# Patient Record
Sex: Male | Born: 1950 | Race: White | Hispanic: No | Marital: Single | Smoking: Current every day smoker
Health system: Southern US, Community
[De-identification: ages and names within clinical notes are randomized; demographics above are authoritative.]

## PROBLEM LIST (undated history)

## (undated) DIAGNOSIS — I10 Essential (primary) hypertension: Secondary | ICD-10-CM

## (undated) DIAGNOSIS — J449 Chronic obstructive pulmonary disease, unspecified: Secondary | ICD-10-CM

## (undated) DIAGNOSIS — E785 Hyperlipidemia, unspecified: Secondary | ICD-10-CM

## (undated) DIAGNOSIS — E119 Type 2 diabetes mellitus without complications: Secondary | ICD-10-CM

## (undated) DIAGNOSIS — S069XAA Unspecified intracranial injury with loss of consciousness status unknown, initial encounter: Secondary | ICD-10-CM

## (undated) DIAGNOSIS — F319 Bipolar disorder, unspecified: Secondary | ICD-10-CM

## (undated) DIAGNOSIS — S069X9A Unspecified intracranial injury with loss of consciousness of unspecified duration, initial encounter: Secondary | ICD-10-CM

## (undated) DIAGNOSIS — R7401 Elevation of levels of liver transaminase levels: Secondary | ICD-10-CM

## (undated) HISTORY — PX: HERNIA REPAIR: SHX51

## (undated) MED ORDER — TRAMADOL 50 MG TAB
50 mg | ORAL_TABLET | Freq: Four times a day (QID) | ORAL | Status: AC | PRN
Start: ? — End: 2007-12-21

---

## 2007-12-10 NOTE — ED Notes (Signed)
Pt states he wants off the board, out of the collar and "going home". States "been laying here too long". Offerred comfort measure, pt states "going home". We talked about the intent of the immobilization and the risk of injury to his spinal cord with "permanent paralysis and maybe you wouldn't be able to walk, or move at all". A/O x4. Speech clear. Uanccompanied. Pt states "don't care, I'm getting out of here".  Board/collard DC'd with patient assistance, actively. Up at bedside. AMA signed. States "going outside to smoke and think about this:". We discuss that if he leaves the building, he is signed out, and would have to be re-registered if he comes back. Pt leaves the building with his cane. NAD.

## 2007-12-11 NOTE — ED Provider Notes (Signed)
Fall  The history is provided by the patient. The accident occurred yesterday. The fall occurred while walking. He landed on hard floor. There was no blood loss. The point of impact was the head and neck (Lower back). The pain is present in the neck (lower back). The pain is at a severity of 5/10. The pain is moderate. He was ambulatory at the scene. There was no entrapment after the fall. There was no drug use involved in the accident. There was no alcohol use involved in the accident. Pertinent negatives include no visual change, no numbness, no abdominal pain, no headaches, no loss of consciousness and no tingling. The risk factors include being elderly.  The symptoms are worsened by activity. He has tried nothing for the symptoms.        Past Medical History   Diagnosis Date   ??? Hypertension    ??? COPD      emphysema   ??? Asthma    ??? Cancer      skin   ??? Psychiatric Disorder      bipolar          No past surgical history on file.      No family history on file.     History   Social History   ??? Marital Status: Divorced     Spouse Name: N/A     Number of Children: N/A   ??? Years of Education: N/A   Occupational History   ??? Not on file.   Social History Main Topics   ??? Tobacco Use: Never   ??? Alcohol Use: Yes   ??? Drug Use: Yes     Special: Marijuana   ??? Sexually Active:    Other Topics Concern   ??? Not on file   Social History Narrative   ??? No narrative on file           ALLERGIES: Review of patient's allergies indicates no known allergies.      Review of Systems   Constitutional: Negative.  Negative for weight loss, malaise/fatigue and diaphoresis.   HENT: Positive for neck pain.    Eyes: Negative.    Respiratory: Negative for hemoptysis, shortness of breath and wheezing.    Cardiovascular: Negative.  Negative for chest pain, palpitations and leg swelling.   Gastrointestinal: Negative.  Negative for abdominal pain.   Genitourinary: Negative.    Musculoskeletal: Positive for back pain. Negative for extremity weakness.    Neurological: Negative for tingling, loss of consciousness, numbness and headaches.   All other systems reviewed and are negative.      Filed Vitals:    12/11/2007 10:17 AM   BP: 123/82   Pulse: 88   Temp: 97.8 ??F (36.6 ??C)   Resp: 16   Height: 5\' 11"  (1.803 m)   Weight: 225 lb (102.059 kg)   SpO2: 97%              Physical Exam   Nursing note and vitals reviewed.  Constitutional: He is oriented. He appears well-developed and well-nourished.   HENT:   Head: Normocephalic and atraumatic.   Right Ear: External ear normal.   Left Ear: External ear normal.   Nose: Nose normal.   Mouth/Throat: Oropharynx is clear and moist.   Eyes: Conjunctivae are normal. Pupils are equal, round, and reactive to light.   Neck: Normal range of motion. Neck supple.   Cardiovascular: Normal rate, regular rhythm, normal heart sounds and intact distal pulses.    Pulmonary/Chest:  Effort normal and breath sounds normal.   Abdominal: Soft. Bowel sounds are normal.   Musculoskeletal: Normal range of motion.        Cervical back: He exhibits tenderness.        Lumbar back: He exhibits tenderness and pain.        Back:    Neurological: He is alert and oriented.   Skin: Skin is warm, dry and intact.   Psychiatric: He has a normal mood and affect. His behavior is normal. Judgment and thought content normal.        MDM Coding   Reviewed: nursing note, vitals and previous chart  Interpretation: x-ray

## 2007-12-11 NOTE — ED Notes (Signed)
Pt d/c instructions complete. Pt verbalized understanding. Script received

## 2007-12-11 NOTE — ED Notes (Signed)
Pt to xray via stretcher, NAD

## 2007-12-11 NOTE — ED Notes (Signed)
States he was typed in here yesterday. Couldn't wait to be seen and left AMA.

## 2007-12-11 NOTE — ED Notes (Signed)
sts was going to sit on sidewalk and fell as sitting, c/o pain in neck 8/10, pain in low back 9/10, sts arms "look like they startin' to swell up again", sts bright lights "like sun shinin' nd high stainless steel, it blinds me"

## 2007-12-11 NOTE — ED Notes (Signed)
X-rays reviewed - degenerative changes noted. No acute findings. The patient was observed in the ED.    Results Reviewed:      No results found for this or any previous visit (from the past 24 hour(s)).    I discussed the results of all labs, procedures, radiographs, and treatments with the patient and available family.  Treatment plan is agreed upon and the patient is ready for discharge.  All voiced understanding of the discharge plan and medication instructions or changes as appropriate.  Questions about treatment in the ED were answered.  All were encouraged to return should symptoms worsen or new problems develop.

## 2008-02-16 LAB — DRUG SCREEN, URINE
ACETAMINOPHEN: NEGATIVE
AMPHETAMINES: NEGATIVE
BARBITURATES: NEGATIVE
BENZODIAZEPINES: NEGATIVE
COCAINE: NEGATIVE
METHADONE: NEGATIVE
Methamphetamines: NEGATIVE
OPIATES: NEGATIVE
PCP(PHENCYCLIDINE): NEGATIVE
THC (TH-CANNABINOL): NEGATIVE
TRICYCLICS: NEGATIVE

## 2008-02-16 LAB — METABOLIC PANEL, COMPREHENSIVE
A-G Ratio: 1.1 — ABNORMAL LOW (ref 1.2–3.5)
ALT (SGPT): 28 U/L — ABNORMAL LOW (ref 30–65)
AST (SGOT): 12 U/L — ABNORMAL LOW (ref 15–37)
Albumin: 3.7 g/dL (ref 3.5–5.0)
Alk. phosphatase: 100 U/L (ref 50–136)
Anion gap: 6 mmol/L — ABNORMAL LOW (ref 7–16)
BUN: 3 mg/dL — ABNORMAL LOW (ref 7–18)
Bilirubin, total: 0.3 MG/DL (ref 0.2–1.1)
CO2: 29 MMOL/L (ref 21–32)
Calcium: 9.3 MG/DL (ref 8.4–10.4)
Chloride: 105 MMOL/L (ref 98–107)
Creatinine: 1.1 mg/dL (ref 0.8–1.3)
GFR est AA: >60 mL/min/{1.73_m2} (ref >60)
GFR est non-AA: >60 mL/min/{1.73_m2} (ref >60)
Globulin: 3.4 g/dL (ref 2.3–3.5)
Glucose: 110 MG/DL — ABNORMAL HIGH (ref 74–106)
Potassium: 3.8 MMOL/L (ref 3.5–5.1)
Protein, total: 7.1 g/dL (ref 6.3–8.2)
Sodium: 140 MMOL/L (ref 136–145)

## 2008-02-16 LAB — CBC WITH AUTOMATED DIFF
ABS. BASOPHILS: 0 10*3/uL (ref 0.0–0.2)
ABS. EOSINOPHILS: 0.1 10*3/uL (ref 0.00–0.80)
ABS. IMM. GRANS.: 0 10*3/uL (ref 0.0–2.0)
ABS. LYMPHOCYTES: 2.9 10*3/uL (ref 0.6–4.3)
ABS. MONOCYTES: 1 10*3/uL — ABNORMAL HIGH (ref 0.1–0.9)
ABS. NEUTROPHILS: 3.1 10*3/uL (ref 1.9–7.8)
BASOPHILS: 0 % — ABNORMAL LOW (ref 0.1–1.6)
EOSINOPHILS: 2 % (ref 0.5–7.8)
HCT: 37.3 % — ABNORMAL LOW (ref 38.1–48.4)
HGB: 13.6 g/dL (ref 12.8–16.5)
IMMATURE GRANULOCYTES: 0.3 % (ref 0.0–2.0)
LYMPHOCYTES: 40 % (ref 14.7–41.3)
MCH: 33.3 PG — ABNORMAL HIGH (ref 26.1–32.9)
MCHC: 36.5 g/dL — ABNORMAL HIGH (ref 31.4–35.0)
MCV: 91.4 FL (ref 79.6–97.8)
MONOCYTES: 14 % — ABNORMAL HIGH (ref 3.2–9.0)
MPV: 9.1 FL — ABNORMAL LOW (ref 9.3–12.9)
NEUTROPHILS: 44 % — ABNORMAL LOW (ref 47.0–74.6)
PLATELET: 187 10*3/uL (ref 140–440)
RBC: 4.08 M/uL — ABNORMAL LOW (ref 4.27–5.68)
RDW: 13.3 % (ref 11.9–14.6)
WBC: 7.1 10*3/uL (ref 4.5–10.5)

## 2008-02-16 NOTE — ED Notes (Signed)
Pt sent from MD office. They state "he kept falling asleep". Pt states "is very tired, was awake all last night clean house, has no other c/o at this time". Pt nad

## 2008-02-16 NOTE — ED Provider Notes (Signed)
HPI Comments: 31 wm sent from the PT clinic because he kept falling asleep. He states he lives in a government supsidized apartment and he had an inspection scheduled today so he spent the entire night cleaning his apartment. He did not want to loose his apartment.     Lethargy  The history is provided by the patient. The current episode started 1 to 2 hours ago. The problem occurs constantly. The problem has not changed since onset. hypersomniaNothing worsens the symptoms. Nothing relieves the symptoms. He has tried nothing for the symptoms.        Past Medical History   Diagnosis Date   ??? Hypertension    ??? COPD      emphysema   ??? Asthma    ??? Cancer      skin   ??? Sleep Disorder    ??? Psychiatric Disorder      bipolar          No past surgical history on file.      No family history on file.     History   Social History   ??? Marital Status: Divorced     Spouse Name: N/A     Number of Children: N/A   ??? Years of Education: N/A   Occupational History   ??? Not on file.   Social History Main Topics   ??? Tobacco Use: Never   ??? Alcohol Use: Yes   ??? Drug Use: Yes     Special: Marijuana   ??? Sexually Active:    Other Topics Concern   ??? Not on file   Social History Narrative   ??? No narrative on file           ALLERGIES: Review of patient's allergies indicates no known allergies.      Review of Systems   All other systems reviewed and are negative.      Filed Vitals:    02/16/2008  3:52 PM 02/16/2008  3:59 PM   BP:  103/65   Pulse:  76   Temp: 97.5 ??F (36.4 ??C)    Resp:  18   Height:  6' (1.829 m)   Weight:  217 lb (98.431 kg)   SpO2:  95%              Physical Exam   Nursing note and vitals reviewed.  Constitutional: He is oriented. He appears well-developed and well-nourished. No distress.   HENT:   Head: Normocephalic and atraumatic.   Right Ear: External ear normal.   Left Ear: External ear normal.   Nose: Nose normal.   Mouth/Throat: Oropharynx is clear and moist. No oropharyngeal exudate.    Eyes: Conjunctivae and extraocular motions are normal. Pupils are equal, round, and reactive to light.   Neck: Normal range of motion. Neck supple.   Cardiovascular: Normal rate, regular rhythm, normal heart sounds and intact distal pulses.    Pulmonary/Chest: Effort normal and breath sounds normal. No respiratory distress.   Abdominal: Soft. Bowel sounds are normal. No tenderness.   Musculoskeletal: Normal range of motion. He exhibits no tenderness.   Neurological: He is alert and oriented. He displays normal reflexes. No cranial nerve deficit. He exhibits normal muscle tone. Coordination normal.        Grip 5/5 and L= R   Skin: Skin is warm. No rash noted.            Coding

## 2008-02-16 NOTE — ED Notes (Signed)
Pt relations called to get pt ride home.

## 2008-02-28 MED ORDER — DICLOFENAC 75 MG TAB, DELAYED RELEASE
75 mg | ORAL_TABLET | Freq: Two times a day (BID) | ORAL | Status: AC
Start: 2008-02-28 — End: 2008-03-09

## 2008-02-28 MED ORDER — CYCLOBENZAPRINE 5 MG TAB
5 mg | ORAL_TABLET | Freq: Three times a day (TID) | ORAL | Status: AC
Start: 2008-02-28 — End: 2008-03-06

## 2008-02-28 NOTE — ED Provider Notes (Signed)
HPI Comments: Pt c/o tripping over a water meter and falling down a hill.  States he hurt his neck, low back, right shoulder, and right forearm during the fall.  He denies LOC, nausea, or any other complaints.    Back Pain   The history is provided by the patient. This is a new problem. The current episode started 2 days ago. The problem has been gradually worsening. The problem occurs constantly. Patient reports no work related injury.The pain is associated with a fall. The pain is present in the lumbar spine, upper back and right side. The pain quality is described as sharp. The pain does not radiate. The pain is moderate. The pain is the same all the time. Stiffness is present all day. Pertinent negatives include no numbness, no headaches, no abdominal pain, no bowel incontinence, no perianal numbness, no paresthesias, no paresis, no tingling and no weakness. He has tried nothing for the symptoms. The patient's surgical history non-contributory        Past Medical History   Diagnosis Date   ??? Hypertension    ??? COPD      emphysema   ??? Asthma    ??? Cancer      skin   ??? Sleep Disorder    ??? Psychiatric Disorder      bipolar          No past surgical history on file.      No family history on file.     History   Social History   ??? Marital Status: Divorced     Spouse Name: N/A     Number of Children: N/A   ??? Years of Education: N/A   Occupational History   ??? Not on file.   Social History Main Topics   ??? Tobacco Use: Yes -- 2.0 packs/day   ??? Alcohol Use: Yes   ??? Drug Use: Yes     Special: Marijuana   ??? Sexually Active:    Other Topics Concern   ??? Not on file   Social History Narrative   ??? No narrative on file           ALLERGIES: Review of patient's allergies indicates no known allergies.      Review of Systems   Constitutional: Negative.    HENT: Negative.    Eyes: Negative.    Respiratory: Negative.    Cardiovascular: Negative.    Gastrointestinal: Negative.  Negative for abdominal pain.    Musculoskeletal: Positive for myalgias, back pain and arthralgias. Negative for joint swelling and gait problem.   Neurological: Negative.  Negative for tingling, weakness, numbness and headaches.   All other systems reviewed and are negative.      Filed Vitals:              Physical Exam   Nursing note and vitals reviewed.  Constitutional: He is oriented. He appears well-developed and well-nourished. He appears not diaphoretic. No distress.   HENT:   Head: Normocephalic and atraumatic.   Mouth/Throat: Oropharynx is clear and moist.   Eyes: Conjunctivae and extraocular motions are normal. Pupils are equal, round, and reactive to light.   Neck: Normal range of motion. Neck supple. No tracheal deviation present.   Musculoskeletal: Normal range of motion. He exhibits tenderness.        Cervical back: He exhibits tenderness and pain. He exhibits normal range of motion, no bony tenderness, no laceration and no spasm.        Lumbar back:  He exhibits tenderness, pain and spasm. He exhibits normal range of motion, no bony tenderness, no swelling, no deformity and no laceration.   Neurological: He is alert and oriented. No cranial nerve deficit.   Skin: Skin is warm and dry. No rash noted. He is not diaphoretic. No erythema.            Coding

## 2008-05-21 LAB — PROTHROMBIN TIME + INR
INR: 1.7 — ABNORMAL HIGH (ref 0.9–1.2)
Prothrombin time: 16.2 SECS — ABNORMAL HIGH (ref 9.5–12.0)

## 2008-05-21 MED ORDER — TRAMADOL-ACETAMINOPHEN 37.5 MG-325 MG TAB
ORAL_TABLET | Freq: Four times a day (QID) | ORAL | Status: AC | PRN
Start: 2008-05-21 — End: 2008-05-31

## 2008-05-21 MED ORDER — CEPHALEXIN 500 MG CAP
500 mg | ORAL_CAPSULE | Freq: Three times a day (TID) | ORAL | Status: AC
Start: 2008-05-21 — End: 2008-05-28

## 2008-05-21 MED ADMIN — cefTRIAXone (ROCEPHIN) 0.5 g in lidocaine (XYLOCAINE) IM syringe: INTRAMUSCULAR | @ 15:00:00 | NDC 54569441500

## 2008-05-21 MED ADMIN — tramadol (ULTRAM) tablet 100 mg: ORAL | @ 12:00:00 | NDC 76439013650

## 2008-05-21 NOTE — ED Notes (Signed)
Dr. Olson at bedside for eval

## 2008-05-21 NOTE — ED Provider Notes (Signed)
HPI Comments: 57 y.o. 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 He remains on Coumadin. Pt has chr lower leg swelling and uses compression stockings. He c/o sev days incr swelling and pain both lower legs. PE There is chr stasis dermatitis with erythema, 3+ pitting edema. Not particularly tender.    Patient is a 57 y.o. male presenting with leg pain. The history is provided by the patient.   Leg Pain   This is a recurrent problem. The current episode started more than 2 days ago. The problem occurs constantly. The problem has been gradually worsening. The pain is present in the left lower leg and right lower leg. The pain quality is described as aching. The pain is at a severity of 5/10. The pain is moderate. Pertinent negatives include no numbness, no limited range of motion and no neck pain. The symptoms are worsened by standing.        Past Medical History   Diagnosis Date   ??? Hypertension    ??? Thromboembolus    ??? COPD      emphysema   ??? Asthma    ??? Cancer      skin   ??? Sleep Disorder    ??? Psychiatric Disorder      bipolar          No past surgical history on file.      No family history on file.     History   Social History   ??? Marital Status: Divorced     Spouse Name: N/A     Number of Children: N/A   ??? Years of Education: N/A   Occupational History   ??? Not on file.   Social History Main Topics   ??? Tobacco Use: Yes -- 2.0 packs/day   ??? Alcohol Use: Yes   ??? Drug Use: Yes     Special: Marijuana   ??? Sexually Active:    Other Topics Concern   ??? Not on file   Social History Narrative   ??? No narrative on file           ALLERGIES: Haldol      Review of Systems   Constitutional: Positive for activity change. Negative for fever.   HENT: Negative for congestion and neck pain.    Respiratory: Negative for shortness of breath.    Cardiovascular: Negative for chest pain.   Gastrointestinal: Negative for vomiting and abdominal pain.    Musculoskeletal: Positive for myalgias. Negative for joint swelling.   Skin: Positive for rash and color change.   Neurological: Negative for numbness and headaches.   Psychiatric/Behavioral: Negative for behavioral problem.    All other systems reviewed and are negative.      Filed Vitals:    05/21/2008  7:46 AM   BP: 113/64   Pulse: 89   Temp: 97.2 ??F (36.2 ??C)   Resp: 20   Height: 5' 11.75" (1.822 m)   Weight: 214 lb (97.07 kg)   SpO2: 94%              Physical Exam   Nursing note and vitals reviewed.  Constitutional: He is oriented. He appears well-developed and well-nourished.   HENT:   Head: Normocephalic.   Eyes: Conjunctivae and extraocular motions are normal. Pupils are equal, round, and reactive to light.   Neck: Normal range of motion.   Cardiovascular: Normal rate.    Pulmonary/Chest: Effort normal and breath sounds normal. No respiratory distress.  Abdominal: Soft. No tenderness.   Musculoskeletal: Normal range of motion. He exhibits edema.   Neurological: He is alert and oriented.   Skin: Skin is warm and dry. Rash noted.   Psychiatric: His behavior is normal.            MDM Coding   Reviewed: previous chart, nursing note and vitals  Interpretation: ultrasound and labs          Procedures

## 2008-05-21 NOTE — ED Notes (Signed)
Pt returned and placed on cycled bp's with sat probe in place.

## 2008-05-21 NOTE — ED Notes (Signed)
Pt sts legs are burning, itching, feet feel numb, sts dx with dvt's

## 2008-05-21 NOTE — ED Notes (Signed)
Pt to U/S via stretcher,NAD

## 2008-05-21 NOTE — ED Notes (Addendum)
Duplex lower extrems - no DVT

## 2008-08-13 NOTE — ED Notes (Signed)
I have reviewed discharge instructions with the patient.  The patient verbalized understanding.

## 2008-08-13 NOTE — ED Provider Notes (Addendum)
Patient is a 57 y.o. male presenting with chest pain. The history is provided by the patient. No language interpreter was used.   Chest Pain (Angina)   This is a new problem. The current episode started 1 to 2 hours ago. The problem has been resolved. Duration of episode(s) is 1 hour. The pain is associated with normal activity. The pain is present in the left side. The pain is at a severity of 0/10 (Was 6/10). The patient is experiencing no pain. The quality of the pain is described as dull. The pain does not radiate. Exacerbated by: nothing. Associated symptoms include cough. Pertinent negatives include no diaphoresis, no fever, no malaise/fatigue, no numbness, no claudication, no exertional chest pressure, no irregular heartbeat, no near-syncope, no orthopnea, no palpitations, no PND, no abdominal pain, no nausea, no vomiting, no headaches, no back pain, no leg pain, no lower extremity edema, no dizziness, no weakness, no hemoptysis, no shortness of breath and no sputum production. He has tried nothing for the symptoms. Risk factors include smoking/tobacco exposure, cardiac disease, diabetes mellitus, obesity, hypertension and male gender. His past medical history is significant for DM, DVT and HTN.Procedural history includes cardiac catheterization.       Past Medical History   Diagnosis Date   ??? Hypertension    ??? Thromboembolus    ??? COPD      emphysema   ??? Asthma    ??? Cancer      skin   ??? Sleep Disorder    ??? Psychiatric Disorder      bipolar   ??? Diabetes      NIDDM   ??? Other Ill-Defined Conditions      DVT ble/ lue          Past Surgical History   Procedure Date   ??? Cardiac surg procedure unlist      heart cath 2007           No family history on file.     History   Social History   ??? Marital Status: Divorced     Spouse Name: N/A     Number of Children: N/A   ??? Years of Education: N/A   Occupational History   ??? Not on file.   Social History Main Topics   ??? Tobacco Use: Yes -- 2.0 packs/day    ??? Alcohol Use: Yes   ??? Drug Use: Yes     Special: Marijuana   ??? Sexually Active:    Other Topics Concern   ??? Not on file   Social History Narrative   ??? No narrative on file           ALLERGIES: Latex, natural rubber and Haldol      Review of Systems   Constitutional: Negative for fever, malaise/fatigue and diaphoresis.   Respiratory: Positive for cough. Negative for hemoptysis, sputum production and shortness of breath.    Cardiovascular: Positive for chest pain. Negative for palpitations, orthopnea, claudication and PND.   Gastrointestinal: Negative for nausea, vomiting and abdominal pain.   Musculoskeletal: Negative for back pain.   Neurological: Negative for dizziness, weakness, numbness and headaches.   All other systems reviewed and are negative.        Filed Vitals:    08/13/2008  7:52 PM 08/13/2008  7:54 PM   BP:  144/81   Pulse: 77    Temp: 97.6 ??F (36.4 ??C)    Resp: 18    Height:  5\' 11"  (1.803 m)  Weight:  214 lb (97.07 kg)   SpO2:  100%              Physical Exam   Nursing note and vitals reviewed.  Constitutional: He is oriented. He appears well-developed and well-nourished. No distress.   HENT:   Head: Normocephalic and atraumatic.   Right Ear: External ear normal.   Left Ear: External ear normal.   Nose: Nose normal.   Mouth/Throat: Oropharynx is clear and moist. No oropharyngeal exudate.   Eyes: Conjunctivae and extraocular motions are normal. Pupils are equal, round, and reactive to light. Right eye exhibits no discharge. Left eye exhibits no discharge. No scleral icterus.   Neck: Normal range of motion. Neck supple. No JVD present. No tracheal deviation present. No thyromegaly present.   Cardiovascular: Normal rate, regular rhythm, normal heart sounds and intact distal pulses.  Exam reveals no gallop and no friction rub.    No murmur heard.  Pulmonary/Chest: Effort normal and breath sounds normal. No stridor. No respiratory distress. He has no wheezes. He has no rales. He exhibits no tenderness.    Abdominal: Soft. Bowel sounds are normal. He exhibits no distension and no mass. No tenderness. He has no rebound and no guarding.   Musculoskeletal: Normal range of motion. He exhibits no edema and no tenderness.   Lymphadenopathy:     He has no cervical adenopathy.   Neurological: He is alert and oriented. No cranial nerve deficit. He exhibits normal muscle tone. Coordination normal.   Skin: Skin is warm and dry. No rash noted. He is not diaphoretic. No erythema. No pallor.   Psychiatric: He has a normal mood and affect. His behavior is normal.            Coding      Procedures    The patient was observed in the ED.  No chest pain during his ED visit.    Results Reviewed:  CARDIAC ENZYMES - NEGATIVE; EKG - NSR, RATE 77; CXR - NEGATIVE       Recent Results (from the past 24 hour(s))   CBC W/ AUTOMATED DIFF    Collection Time 08/13/08  8:00 PM   Component Value Range   ??? WBC 7.3  4.0 - 10.5 (K/uL)   ??? RBC 4.19 (*) 4.27 - 5.68 (M/uL)   ??? HGB 13.9  12.8 - 16.5 (g/dL)   ??? HCT 38.8 (*) 40.4 - 52.1 (%)   ??? MCV 92.6  79.6 - 97.8 (FL)   ??? MCH 33.2 (*) 26.1 - 32.9 (PG)   ??? MCHC 35.8 (*) 31.4 - 35.0 (g/dL)   ??? RDW 13.3  11.9 - 14.6 (%)   ??? PLATELET 162  140 - 440 (K/uL)   ??? MPV 9.7 (*) 10.8 - 14.1 (FL)   ??? DF AUTOMATED     ??? NEUTROPHILS 38 (*) 43 - 78 (%)   ??? LYMPHOCYTES 47 (*) 13 - 44 (%)   ??? MONOCYTES 11  4.0 - 12.0 (%)   ??? EOSINOPHILS 3 (*) 0.0 - 0.7 (%)   ??? BASOPHILS 1  0.0 - 2.0 (%)   ??? ABSOLUTE NEUTS 2.8  1.7 - 8.2 (K/UL)   ??? ABSOLUTE LYMPHS 3.4  0.5 - 4.6 (K/UL)   ??? ABSOLUTE MONOS 0.8  0.1 - 1.3 (K/UL)   ??? ABSOLUTE EOSINS 0.2  0.0 - 0.2 (K/UL)   ??? ABSOLUTE BASOS 0.0  0.0 - 0.2 (K/UL)   ??? ABS. IMM. GRANS. 0.1  0.0 - 2.0 (  K/UL)   METABOLIC PANEL, COMPREHENSIVE    Collection Time 08/13/08  8:00 PM   Component Value Range   ??? Sodium 135 (*) 136 - 145 (MMOL/L)   ??? Potassium 3.9  3.5 - 5.1 (MMOL/L)   ??? Chloride 99  98 - 107 (MMOL/L)   ??? CO2 26  21 - 32 (MMOL/L)   ??? Anion gap 10  7 - 16 (mmol/L)    ??? Glucose 80  74 - 106 (MG/DL)   ??? BUN 8  7 - 18 (MG/DL)   ??? Creatinine 1.1 (*) 0.6 - 1.0 (MG/DL)   ??? GFR est AA >60  >60 (ml/min/1.48m2)   ??? GFR est non-AA >60  >60 (ml/min/1.17m2)   ??? Calcium 9.0  8.4 - 10.4 (MG/DL)   ??? Bilirubin, total 0.4  0.2 - 1.1 (MG/DL)   ??? ALT 25 (*) 39 - 65 (U/L)   ??? AST 14 (*) 15 - 37 (U/L)   ??? Alk. phosphatase 102  50 - 136 (U/L)   ??? Protein, total 7.3  6.3 - 8.2 (g/dL)   ??? Albumin 3.7  3.5 - 5.0 (g/dL)   ??? Globulin 3.6 (*) 2.3 - 3.5 (g/dL)   ??? A-G Ratio 1.0 (*) 1.2 - 3.5 ( )         I discussed the results of all labs, procedures, radiographs, and treatments with the patient and available family.  Treatment plan is agreed upon and the patient is ready for discharge.  All voiced understanding of the discharge plan and medication instructions or changes as appropriate.  Questions about treatment in the ED were answered.  All were encouraged to return should symptoms worsen or new problems develop.

## 2008-08-13 NOTE — ED Notes (Addendum)
Pt c/o L sided chest pain described as "a rubber hammer hitting a piece of wood; like a thumping feeling." c/o sob, dizziness and n/v yesterday;. Dr Clovis Riley has evaluated pt and is putting in orders; pt has had normal heart cath in past per pt.

## 2008-08-13 NOTE — ED Notes (Signed)
Contacted yellow cab for transportation home.

## 2008-08-13 NOTE — ED Notes (Signed)
Pt requesting taxi home; will call house supervisor.

## 2008-08-14 LAB — METABOLIC PANEL, COMPREHENSIVE
A-G Ratio: 1 — ABNORMAL LOW (ref 1.2–3.5)
ALT (SGPT): 25 U/L — ABNORMAL LOW (ref 39–65)
AST (SGOT): 14 U/L — ABNORMAL LOW (ref 15–37)
Albumin: 3.7 g/dL (ref 3.5–5.0)
Alk. phosphatase: 102 U/L (ref 50–136)
Anion gap: 10 mmol/L (ref 7–16)
BUN: 8 MG/DL (ref 7–18)
Bilirubin, total: 0.4 MG/DL (ref 0.2–1.1)
CO2: 26 MMOL/L (ref 21–32)
Calcium: 9 MG/DL (ref 8.4–10.4)
Chloride: 99 MMOL/L (ref 98–107)
Creatinine: 1.1 MG/DL — ABNORMAL HIGH (ref 0.6–1.0)
GFR est AA: >60 mL/min/{1.73_m2} (ref >60)
GFR est non-AA: >60 mL/min/{1.73_m2} (ref >60)
Globulin: 3.6 g/dL — ABNORMAL HIGH (ref 2.3–3.5)
Glucose: 80 MG/DL (ref 74–106)
Potassium: 3.9 MMOL/L (ref 3.5–5.1)
Protein, total: 7.3 g/dL (ref 6.3–8.2)
Sodium: 135 MMOL/L — ABNORMAL LOW (ref 136–145)

## 2008-08-14 LAB — CBC WITH AUTOMATED DIFF
ABS. BASOPHILS: 0 10*3/uL (ref 0.0–0.2)
ABS. EOSINOPHILS: 0.2 10*3/uL (ref 0.0–0.2)
ABS. IMM. GRANS.: 0.1 10*3/uL (ref 0.0–2.0)
ABS. LYMPHOCYTES: 3.4 10*3/uL (ref 0.5–4.6)
ABS. MONOCYTES: 0.8 10*3/uL (ref 0.1–1.3)
ABS. NEUTROPHILS: 2.8 10*3/uL (ref 1.7–8.2)
BASOPHILS: 1 % (ref 0.0–2.0)
EOSINOPHILS: 3 % — ABNORMAL HIGH (ref 0.0–0.7)
HCT: 38.8 % — ABNORMAL LOW (ref 40.4–52.1)
HGB: 13.9 g/dL (ref 12.8–16.5)
LYMPHOCYTES: 47 % — ABNORMAL HIGH (ref 13–44)
MCH: 33.2 PG — ABNORMAL HIGH (ref 26.1–32.9)
MCHC: 35.8 g/dL — ABNORMAL HIGH (ref 31.4–35.0)
MCV: 92.6 FL (ref 79.6–97.8)
MONOCYTES: 11 % (ref 4.0–12.0)
MPV: 9.7 FL — ABNORMAL LOW (ref 10.8–14.1)
NEUTROPHILS: 38 % — ABNORMAL LOW (ref 43–78)
PLATELET: 162 10*3/uL (ref 140–440)
RBC: 4.19 M/uL — ABNORMAL LOW (ref 4.27–5.68)
RDW: 13.3 % (ref 11.9–14.6)
WBC: 7.3 10*3/uL (ref 4.0–10.5)

## 2008-10-02 LAB — CBC WITH AUTOMATED DIFF
ABS. BASOPHILS: 0 10*3/uL (ref 0.0–0.2)
ABS. EOSINOPHILS: 0.2 10*3/uL (ref 0.0–0.8)
ABS. IMM. GRANS.: 0 10*3/uL (ref 0.0–2.0)
ABS. LYMPHOCYTES: 3.4 10*3/uL (ref 0.5–4.6)
ABS. MONOCYTES: 0.7 10*3/uL (ref 0.1–1.3)
ABS. NEUTROPHILS: 2.9 10*3/uL (ref 1.7–8.2)
BASOPHILS: 1 % (ref 0.0–2.0)
EOSINOPHILS: 3 % (ref 0.5–7.8)
HCT: 39.7 % — ABNORMAL LOW (ref 40.4–52.1)
HGB: 14.1 g/dL (ref 12.8–16.5)
LYMPHOCYTES: 46 % — ABNORMAL HIGH (ref 13–44)
MCH: 32.9 PG (ref 26.1–32.9)
MCHC: 35.5 g/dL — ABNORMAL HIGH (ref 31.4–35.0)
MCV: 92.8 FL (ref 79.6–97.8)
MONOCYTES: 10 % (ref 4.0–12.0)
MPV: 9.6 FL — ABNORMAL LOW (ref 10.8–14.1)
NEUTROPHILS: 40 % — ABNORMAL LOW (ref 43–78)
PLATELET: 199 10*3/uL (ref 140–440)
RBC: 4.28 M/uL (ref 4.27–5.68)
RDW: 13.4 % (ref 11.9–14.6)
WBC: 7.3 10*3/uL (ref 4.0–10.5)

## 2008-10-02 LAB — PROTHROMBIN TIME + INR
INR: 2 — ABNORMAL HIGH (ref 0.9–1.2)
Prothrombin time: 19.4 SECS — ABNORMAL HIGH (ref 9.4–10.8)

## 2008-10-02 MED ORDER — CEPHALEXIN 500 MG CAP
500 mg | ORAL_CAPSULE | Freq: Two times a day (BID) | ORAL | Status: AC
Start: 2008-10-02 — End: 2008-10-12

## 2008-10-02 NOTE — ED Notes (Signed)
Offered breakfast tray, PT interested, tray ordered

## 2008-10-02 NOTE — ED Notes (Signed)
Patient's Medications   Start Taking    CEPHALEXIN (KEFLEX) 500 MG CAPSULE    Take 2 Caps by mouth two (2) times a day for 10 days.   Continue Taking    BENZTROPINE (COGENTIN) 2 MG TABLET    Take 2 mg by mouth two (2) times a day.    DIVALPROEX EC (DEPAKOTE) 500 MG EC TABLET    Take 500 mg by mouth daily. 5 tablets at night     DOCUSATE SODIUM (COLACE PO)    take  by mouth two (2) times a day.    HYDROCODONE-ACETAMINOPHEN (LORTAB 7.5) 7.5-500 MG PER TABLET    take 1 Tab by mouth two (2) times a day.    LISINOPRIL (PRINIVIL, ZESTRIL) 10 MG TABLET    Take 10 mg by mouth daily.    LORATADINE (CLARITIN) 10 MG TABLET    take 10 mg by mouth daily.    METFORMIN (GLUCOPHAGE) 500 MG TABLET    Take 500 mg by mouth two (2) times daily (with meals).    RISPERIDONE (RISPERDAL) 3 MG TABLET    Take 4 mg by mouth nightly. 2 tabs in am    TRAZODONE HCL (TRAZODONE PO)    Take 50 mg by mouth nightly.    WARFARIN (COUMADIN) 3 MG TABLET    Take 3 mg by mouth daily.   These Medications have changed    No medications on file   Stop Taking    OTHER,NON-FORMULARY,    Take 250 mg by mouth. Pt does not remember the name of BP med

## 2008-10-02 NOTE — ED Provider Notes (Signed)
HPI Comments: Pt asleep x 2 on arrival to room    Patient is a 58 y.o. male presenting with leg pain and sinusitis. The history is provided by the patient. No language interpreter was used.   Leg Pain   This is a recurrent problem. The current episode started more than 1 week ago. The problem occurs constantly. The problem has not changed since onset. The pain is present in the left lower leg and right lower leg. The quality of the pain is described as aching. The pain is at a severity of 3/10. Associated symptoms include limited range of motion and stiffness. Pertinent negatives include no numbness. The symptoms are aggravated by contact and palpation. Treatments tried: pt adequately anticoagulated with coumadin. There has been no history of extremity trauma.   Nasal Congestion   This is a new problem. The current episode started more than 2 days ago. The problem has not changed since onset. There has been no fever. The patient is experiencing no pain. The pain has been constant since onset. Associated symptoms include congestion and sinus pressure. Pertinent negatives include no chills, no hoarse voice, no cough, no shortness of breath and no rhinorrhea. He has tried nothing for the symptoms.        Past Medical History   Diagnosis Date   ??? Hypertension    ??? Thromboembolus    ??? COPD      emphysema   ??? Asthma    ??? Cancer      skin   ??? Sleep Disorder    ??? Psychiatric Disorder      bipolar   ??? Diabetes      NIDDM   ??? Other Ill-Defined Conditions      DVT ble/ lue          Past Surgical History   Procedure Date   ??? Cardiac surg procedure unlist      heart cath 2007           No family history on file.     History   Social History   ??? Marital Status: Divorced     Spouse Name: N/A     Number of Children: N/A   ??? Years of Education: N/A   Occupational History   ??? Not on file.   Social History Main Topics   ??? Tobacco Use: Yes -- 2.0 packs/day   ??? Alcohol Use: Yes   ??? Drug Use: Yes     Special: Marijuana    ??? Sexually Active:    Other Topics Concern   ??? Not on file   Social History Narrative   ??? No narrative on file           ALLERGIES: Latex, natural rubber and Haldol      Review of Systems   Constitutional: Negative for fever and chills.   HENT: Positive for congestion and sinus pressure. Negative for hoarse voice and rhinorrhea.    Respiratory: Negative for cough and shortness of breath.    Skin: Negative for rash.   Neurological: Negative for weakness and numbness.   All other systems reviewed and are negative.        Filed Vitals:    10/02/2008  4:39 AM   BP: 151/88   Pulse: 105   Temp: 98.3 ??F (36.8 ??C)   Resp: 22   Height: 5\' 11"  (1.803 m)   Weight: 225 lb (102.059 kg)   SpO2: 96%  Physical Exam   Nursing note and vitals reviewed.  Constitutional: He is oriented. He appears well-developed and well-nourished.   HENT:   Head: Normocephalic and atraumatic.   Right Ear: External ear normal.   Left Ear: External ear normal.   Nose: Nose normal.   Mouth/Throat: Oropharynx is clear and moist.   Eyes: Conjunctivae are normal. Pupils are equal, round, and reactive to light.   Neck: Normal range of motion. Neck supple.   Cardiovascular: Normal rate, regular rhythm and normal heart sounds.    Pulmonary/Chest: Effort normal and breath sounds normal. No respiratory distress. He has no wheezes. He has no rales.   Abdominal: Soft. Bowel sounds are normal.   Musculoskeletal: Normal range of motion.   Neurological: He is alert and oriented.   Skin: Skin is warm and dry. There is erythema.        Chronic changes both lower extremities   Psychiatric: He has a normal mood and affect. His behavior is normal.            MDM Coding   Reviewed: nursing note and vitals  Reviewed previous: labs  Interpretation: labs and x-ray          Procedures

## 2008-10-02 NOTE — ED Notes (Signed)
Pt drowsy when attempting to review pt's discharge instructions, pupils dilated.  Pt arousable, responds to stimulation. When asked why pt was so drowsy, pt replied, " I took my night medications.  I took my pain medicine. "  Confirmed w/ pt that he took his lortab pill.   Breakfast tray ordered.  Ama, day shift RN to attempt to discharge after breakfast.  Sats 95 % on RA, vitals stable.

## 2008-10-02 NOTE — ED Notes (Signed)
Pt has had bilateral leg swelling and pain for 2 years.  Pt came to ER per EMS for increased LE swelling "and I got legions on my legs now.  Can the Dr check my lungs tonight too?  I've got chest congestion."

## 2008-10-02 NOTE — ED Notes (Signed)
PT served breakfast, A&Ox3, Copy of discharge instructions given to patient. Denies any questions at this time.

## 2008-10-02 NOTE — ED Notes (Signed)
Pt voided 1000 ml per urinal and taken to xray per stretcher w/ tech.

## 2008-10-02 NOTE — ED Notes (Signed)
The patient was observed in the ED.    Results Reviewed:      Recent Results (from the past 24 hour(s))   CBC W/ AUTOMATED DIFF    Collection Time 10/02/08  5:55 AM   Component Value Range   ??? WBC 7.3  4.0 - 10.5 (K/uL)   ??? RBC 4.28  4.27 - 5.68 (M/uL)   ??? HGB 14.1  12.8 - 16.5 (g/dL)   ??? HCT 39.7 (*) 40.4 - 52.1 (%)   ??? MCV 92.8  79.6 - 97.8 (FL)   ??? MCH 32.9  26.1 - 32.9 (PG)   ??? MCHC 35.5 (*) 31.4 - 35.0 (g/dL)   ??? RDW 13.4  11.9 - 14.6 (%)   ??? PLATELET 199  140 - 440 (K/uL)   ??? MPV 9.6 (*) 10.8 - 14.1 (FL)   ??? DF AUTOMATED     ??? NEUTROPHILS 40 (*) 43 - 78 (%)   ??? LYMPHOCYTES 46 (*) 13 - 44 (%)   ??? MONOCYTES 10  4.0 - 12.0 (%)   ??? EOSINOPHILS 3  0.5 - 7.8 (%)   ??? BASOPHILS 1  0.0 - 2.0 (%)   ??? ABSOLUTE NEUTS 2.9  1.7 - 8.2 (K/UL)   ??? ABSOLUTE LYMPHS 3.4  0.5 - 4.6 (K/UL)   ??? ABSOLUTE MONOS 0.7  0.1 - 1.3 (K/UL)   ??? ABSOLUTE EOSINS 0.2  0.0 - 0.8 (K/UL)   ??? ABSOLUTE BASOS 0.0  0.0 - 0.2 (K/UL)   ??? ABS. IMM. GRANS. 0.0  0.0 - 2.0 (K/UL)   PROTHROMBIN TIME    Collection Time 10/02/08  5:55 AM   Component Value Range   ??? Prothrombin Time-PT 19.4 (*) 9.4 - 10.8 (SECS)   ??? INR 2.0 (*) 0.9 - 1.2          I discussed the results of all labs, procedures, radiographs, and treatments with the patient and available family.  Treatment plan is agreed upon and the patient is ready for discharge.  All voiced understanding of the discharge plan and medication instructions or changes as appropriate.  Questions about treatment in the ED were answered.  All were encouraged to return should symptoms worsen or new problems develop.

## 2008-10-07 NOTE — ED Notes (Signed)
The patient was observed in the ED.    Results Reviewed:      Recent Results (from the past 24 hour(s))   CBC W/ AUTOMATED DIFF    Collection Time 10/07/08  6:30 PM   Component Value Range   ??? WBC 8.3  4.0 - 10.5 (K/uL)   ??? RBC 4.46  4.27 - 5.68 (M/uL)   ??? HGB 14.8  12.8 - 16.5 (g/dL)   ??? HCT 40.4  40.4 - 52.1 (%)   ??? MCV 90.6  79.6 - 97.8 (FL)   ??? MCH 33.2 (*) 26.1 - 32.9 (PG)   ??? MCHC 36.6 (*) 31.4 - 35.0 (g/dL)   ??? RDW 13.1  11.9 - 14.6 (%)   ??? PLATELET 235  140 - 440 (K/uL)   ??? MPV 10.0 (*) 10.8 - 14.1 (FL)   ??? DF AUTOMATED     ??? NEUTROPHILS 51  43 - 78 (%)   ??? LYMPHOCYTES 39  13 - 44 (%)   ??? MONOCYTES 9  4.0 - 12.0 (%)   ??? EOSINOPHILS 1  0.5 - 7.8 (%)   ??? BASOPHILS 0  0.0 - 2.0 (%)   ??? ABSOLUTE NEUTS 4.1  1.7 - 8.2 (K/UL)   ??? ABSOLUTE LYMPHS 3.2  0.5 - 4.6 (K/UL)   ??? ABSOLUTE MONOS 0.8  0.1 - 1.3 (K/UL)   ??? ABSOLUTE EOSINS 0.1  0.0 - 0.8 (K/UL)   ??? ABSOLUTE BASOS 0.0  0.0 - 0.2 (K/UL)   ??? ABS. IMM. GRANS. 0.0  0.0 - 2.0 (K/UL)   METABOLIC PANEL, BASIC    Collection Time 10/07/08  6:30 PM   Component Value Range   ??? Sodium 132 (*) 136 - 145 (MMOL/L)   ??? Potassium 3.6  3.5 - 5.1 (MMOL/L)   ??? Chloride 100  98 - 107 (MMOL/L)   ??? CO2 24  21 - 32 (MMOL/L)   ??? Anion gap 8  7 - 16 (mmol/L)   ??? Glucose 101  74 - 106 (MG/DL)   ??? BUN 12  7 - 18 (MG/DL)   ??? Creatinine 1.1 (*) 0.6 - 1.0 (MG/DL)   ??? GFR est AA >60  >60 (ml/min/1.68m2)   ??? GFR est non-AA >60  >60 (ml/min/1.59m2)   ??? Calcium 9.1  8.4 - 10.4 (MG/DL)   PROTHROMBIN TIME    Collection Time 10/07/08  6:30 PM   Component Value Range   ??? Prothrombin Time-PT 11.5 (*) 9.4 - 10.8 (SECS)   ??? INR 1.2  0.9 - 1.2           I discussed the results of all labs, procedures, radiographs, and treatments with the patient and available family.  Treatment plan is agreed upon and the patient is ready for discharge.  All voiced understanding of the discharge plan and medication instructions or changes as appropriate.  Questions about treatment in the ED were answered.  All were encouraged to return should symptoms worsen or new problems develop.

## 2008-10-07 NOTE — ED Notes (Signed)
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 On 28th. Leg warm to touch but not hot. Redness and swelling noted.

## 2008-10-07 NOTE — ED Notes (Signed)
Discharge instructions covered verbally.  Copy of instructions provided.

## 2008-10-07 NOTE — ED Provider Notes (Signed)
HPI Comments: I need my pain pills refilled.    Patient is a 58 y.o. male presenting with leg pain. The history is provided by the patient. No language interpreter was used.   Leg Pain   This is a chronic problem. Episode onset: more than  a year ago. The problem occurs constantly. The problem has not changed since onset. The pain is present in the right lower leg and left lower leg. The quality of the pain is described as aching. The pain is at a severity of 4/10. Associated symptoms include numbness, limited range of motion, stiffness and tingling. The symptoms are aggravated by standing, contact and palpation. There has been no history of extremity trauma.        Past Medical History   Diagnosis Date   ??? Hypertension    ??? Thromboembolus    ??? COPD      emphysema   ??? Asthma    ??? Cancer      skin   ??? Sleep Disorder    ??? Psychiatric Disorder      bipolar   ??? Diabetes      NIDDM   ??? Other Ill-Defined Conditions      DVT ble/ lue          Past Surgical History   Procedure Date   ??? Cardiac surg procedure unlist      heart cath 2007           No family history on file.     History   Social History   ??? Marital Status: Divorced     Spouse Name: N/A     Number of Children: N/A   ??? Years of Education: N/A   Occupational History   ??? Not on file.   Social History Main Topics   ??? Tobacco Use: Yes -- 2.0 packs/day   ??? Alcohol Use: Yes   ??? Drug Use: Yes     Special: Marijuana   ??? Sexually Active:    Other Topics Concern   ??? Not on file   Social History Narrative   ??? No narrative on file           ALLERGIES: Latex, natural rubber and Haldol      Review of Systems   Constitutional: Negative for fever and chills.   Respiratory: Negative for shortness of breath and wheezing.    Musculoskeletal: Negative for joint swelling.   Neurological: Positive for tingling and numbness. Negative for weakness.   All other systems reviewed and are negative.        Filed Vitals:    10/07/2008  4:40 PM 10/07/2008  4:41 PM   BP:  164/99   Pulse:  93    Temp: 98.6 ??F (37 ??C)    Resp: 20    Height: 5\' 11"  (1.803 m)    Weight: 238 lb (107.956 kg)    SpO2:  95%              Physical Exam   Nursing note and vitals reviewed.  Constitutional: He is oriented. He appears well-developed and well-nourished.   HENT:   Head: Normocephalic and atraumatic.   Right Ear: External ear normal.   Left Ear: External ear normal.   Nose: Nose normal.   Mouth/Throat: Oropharynx is clear and moist.   Eyes: Conjunctivae are normal. Pupils are equal, round, and reactive to light.   Neck: Normal range of motion. Neck supple.   Cardiovascular: Normal rate, regular rhythm and  normal heart sounds.    Pulmonary/Chest: Effort normal and breath sounds normal. No respiratory distress. He has no wheezes. He has no rales.   Abdominal: Soft. Bowel sounds are normal. He exhibits no distension. No tenderness. He has no rebound and no guarding.   Musculoskeletal: Normal range of motion. He exhibits edema and tenderness.   Neurological: He is alert and oriented.   Skin: Skin is warm and dry. Rash noted. No erythema.   Psychiatric: He has a normal mood and affect. Thought content normal. He is slowed. Cognition and memory are impaired.            MDM Coding   Reviewed: nursing note, vitals and previous chart  Reviewed previous: labs  Interpretation: labs          Procedures

## 2008-10-07 NOTE — ED Notes (Signed)
meds pulled.  Pt not in room to administer.

## 2008-10-07 NOTE — ED Notes (Signed)
Discharge noted.  Unable to locate pt in room or waiting room.

## 2008-10-08 LAB — CBC WITH AUTOMATED DIFF
ABS. BASOPHILS: 0 10*3/uL (ref 0.0–0.2)
ABS. EOSINOPHILS: 0.1 10*3/uL (ref 0.0–0.8)
ABS. IMM. GRANS.: 0 10*3/uL (ref 0.0–2.0)
ABS. LYMPHOCYTES: 3.2 10*3/uL (ref 0.5–4.6)
ABS. MONOCYTES: 0.8 10*3/uL (ref 0.1–1.3)
ABS. NEUTROPHILS: 4.1 10*3/uL (ref 1.7–8.2)
BASOPHILS: 0 % (ref 0.0–2.0)
EOSINOPHILS: 1 % (ref 0.5–7.8)
HCT: 40.4 % (ref 40.4–52.1)
HGB: 14.8 g/dL (ref 12.8–16.5)
LYMPHOCYTES: 39 % (ref 13–44)
MCH: 33.2 PG — ABNORMAL HIGH (ref 26.1–32.9)
MCHC: 36.6 g/dL — ABNORMAL HIGH (ref 31.4–35.0)
MCV: 90.6 FL (ref 79.6–97.8)
MONOCYTES: 9 % (ref 4.0–12.0)
MPV: 10 FL — ABNORMAL LOW (ref 10.8–14.1)
NEUTROPHILS: 51 % (ref 43–78)
PLATELET: 235 10*3/uL (ref 140–440)
RBC: 4.46 M/uL (ref 4.27–5.68)
RDW: 13.1 % (ref 11.9–14.6)
WBC: 8.3 10*3/uL (ref 4.0–10.5)

## 2008-10-08 LAB — METABOLIC PANEL, BASIC
Anion gap: 8 mmol/L (ref 7–16)
BUN: 12 MG/DL (ref 7–18)
CO2: 24 MMOL/L (ref 21–32)
Calcium: 9.1 MG/DL (ref 8.4–10.4)
Chloride: 100 MMOL/L (ref 98–107)
Creatinine: 1.1 MG/DL — ABNORMAL HIGH (ref 0.6–1.0)
GFR est AA: >60 mL/min/{1.73_m2} (ref >60)
GFR est non-AA: >60 mL/min/{1.73_m2} (ref >60)
Glucose: 101 MG/DL (ref 74–106)
Potassium: 3.6 MMOL/L (ref 3.5–5.1)
Sodium: 132 MMOL/L — ABNORMAL LOW (ref 136–145)

## 2008-10-08 LAB — PROTHROMBIN TIME + INR
INR: 1.2 (ref 0.9–1.2)
Prothrombin time: 11.5 SECS — ABNORMAL HIGH (ref 9.4–10.8)

## 2008-10-08 MED ORDER — PROPOXYPHENE N-ACETAMINOPHEN 100 MG-650 MG TAB
100-650 mg | ORAL_TABLET | Freq: Four times a day (QID) | ORAL | Status: AC | PRN
Start: 2008-10-08 — End: 2008-10-14

## 2008-10-08 MED ORDER — WARFARIN 3 MG TAB
3 mg | ORAL_TABLET | Freq: Every day | ORAL | Status: AC
Start: 2008-10-08 — End: 2009-10-02

## 2008-10-08 MED ADMIN — propoxyphene napsylate-acetaminophen (DARVOCET-N 100) 100-650 mg per tablet 1 Tab: ORAL | @ 01:00:00 | NDC 51079032201

## 2008-10-08 MED ADMIN — warfarin (COUMADIN) tablet 5 mg: ORAL | @ 01:00:00 | NDC 00056017201

## 2008-10-08 MED ADMIN — furosemide (LASIX) tablet 40 mg: ORAL | @ 01:00:00 | NDC 68084001611

## 2008-10-08 MED FILL — WARFARIN 5 MG TAB: 5 mg | ORAL | Qty: 1

## 2008-10-08 MED FILL — FUROSEMIDE 40 MG TAB: 40 mg | ORAL | Qty: 1

## 2008-10-08 MED FILL — PROPOXYPHENE N-ACETAMINOPHEN 100 MG-650 MG TAB: 100-650 mg | ORAL | Qty: 1

## 2014-08-17 ENCOUNTER — Emergency Department (HOSPITAL_COMMUNITY): Payer: Medicare Other

## 2014-08-17 ENCOUNTER — Emergency Department (HOSPITAL_COMMUNITY)
Admission: EM | Admit: 2014-08-17 | Discharge: 2014-08-17 | Disposition: A | Discharge status: Home / Self Care | Payer: Medicare Other | Attending: Emergency Medicine | Admitting: Emergency Medicine

## 2014-08-17 ENCOUNTER — Encounter (HOSPITAL_COMMUNITY): Payer: Self-pay | Admitting: Emergency Medicine

## 2014-08-17 DIAGNOSIS — R0602 Shortness of breath: Secondary | ICD-10-CM | POA: Insufficient documentation

## 2014-08-17 DIAGNOSIS — E119 Type 2 diabetes mellitus without complications: Secondary | ICD-10-CM | POA: Insufficient documentation

## 2014-08-17 DIAGNOSIS — Z720 Tobacco use: Secondary | ICD-10-CM | POA: Diagnosis not present

## 2014-08-17 DIAGNOSIS — R197 Diarrhea, unspecified: Secondary | ICD-10-CM | POA: Insufficient documentation

## 2014-08-17 DIAGNOSIS — Z91040 Latex allergy status: Secondary | ICD-10-CM | POA: Insufficient documentation

## 2014-08-17 DIAGNOSIS — F319 Bipolar disorder, unspecified: Secondary | ICD-10-CM | POA: Insufficient documentation

## 2014-08-17 DIAGNOSIS — I10 Essential (primary) hypertension: Secondary | ICD-10-CM | POA: Diagnosis not present

## 2014-08-17 DIAGNOSIS — R1084 Generalized abdominal pain: Secondary | ICD-10-CM | POA: Diagnosis present

## 2014-08-17 DIAGNOSIS — R519 Headache, unspecified: Secondary | ICD-10-CM

## 2014-08-17 DIAGNOSIS — Z79899 Other long term (current) drug therapy: Secondary | ICD-10-CM | POA: Diagnosis not present

## 2014-08-17 DIAGNOSIS — Z87820 Personal history of traumatic brain injury: Secondary | ICD-10-CM | POA: Insufficient documentation

## 2014-08-17 DIAGNOSIS — Z880 Allergy status to penicillin: Secondary | ICD-10-CM | POA: Insufficient documentation

## 2014-08-17 DIAGNOSIS — M7989 Other specified soft tissue disorders: Secondary | ICD-10-CM | POA: Diagnosis not present

## 2014-08-17 DIAGNOSIS — H538 Other visual disturbances: Secondary | ICD-10-CM | POA: Diagnosis not present

## 2014-08-17 DIAGNOSIS — R51 Headache: Secondary | ICD-10-CM | POA: Diagnosis not present

## 2014-08-17 DIAGNOSIS — J449 Chronic obstructive pulmonary disease, unspecified: Secondary | ICD-10-CM | POA: Diagnosis not present

## 2014-08-17 DIAGNOSIS — K920 Hematemesis: Secondary | ICD-10-CM | POA: Diagnosis not present

## 2014-08-17 DIAGNOSIS — R109 Unspecified abdominal pain: Secondary | ICD-10-CM

## 2014-08-17 HISTORY — DX: Type 2 diabetes mellitus without complications: E11.9

## 2014-08-17 HISTORY — DX: Unspecified intracranial injury with loss of consciousness of unspecified duration, initial encounter: S06.9X9A

## 2014-08-17 HISTORY — DX: Bipolar disorder, unspecified: F31.9

## 2014-08-17 HISTORY — DX: Unspecified intracranial injury with loss of consciousness status unknown, initial encounter: S06.9XAA

## 2014-08-17 HISTORY — DX: Essential (primary) hypertension: I10

## 2014-08-17 HISTORY — DX: Hyperlipidemia, unspecified: E78.5

## 2014-08-17 HISTORY — DX: Chronic obstructive pulmonary disease, unspecified: J44.9

## 2014-08-17 LAB — I-STAT CHEM 8, ED
BUN: 15 mg/dL (ref 6–23)
Calcium, Ion: 1.25 mmol/L (ref 1.13–1.30)
Chloride: 102 mEq/L (ref 96–112)
Creatinine, Ser: 1.2 mg/dL (ref 0.50–1.35)
Glucose, Bld: 142 mg/dL — ABNORMAL HIGH (ref 70–99)
HCT: 42 % (ref 39.0–52.0)
HEMOGLOBIN: 14.3 g/dL (ref 13.0–17.0)
POTASSIUM: 3.5 mmol/L (ref 3.5–5.1)
SODIUM: 139 mmol/L (ref 135–145)
TCO2: 21 mmol/L (ref 0–100)

## 2014-08-17 LAB — CBC WITH DIFFERENTIAL/PLATELET
Basophils Absolute: 0.1 10*3/uL (ref 0.0–0.1)
Basophils Relative: 1 % (ref 0–1)
EOS ABS: 0.3 10*3/uL (ref 0.0–0.7)
Eosinophils Relative: 3 % (ref 0–5)
HEMATOCRIT: 40.1 % (ref 39.0–52.0)
Hemoglobin: 14.5 g/dL (ref 13.0–17.0)
LYMPHS PCT: 20 % (ref 12–46)
Lymphs Abs: 2.2 10*3/uL (ref 0.7–4.0)
MCH: 32.5 pg (ref 26.0–34.0)
MCHC: 36.2 g/dL — ABNORMAL HIGH (ref 30.0–36.0)
MCV: 89.9 fL (ref 78.0–100.0)
MONO ABS: 0.8 10*3/uL (ref 0.1–1.0)
MONOS PCT: 8 % (ref 3–12)
NEUTROS ABS: 7.4 10*3/uL (ref 1.7–7.7)
NEUTROS PCT: 68 % (ref 43–77)
Platelets: 321 10*3/uL (ref 150–400)
RBC: 4.46 MIL/uL (ref 4.22–5.81)
RDW: 12.8 % (ref 11.5–15.5)
WBC: 10.8 10*3/uL — ABNORMAL HIGH (ref 4.0–10.5)

## 2014-08-17 LAB — COMPREHENSIVE METABOLIC PANEL
ALT: 27 U/L (ref 0–53)
AST: 22 U/L (ref 0–37)
Albumin: 3.6 g/dL (ref 3.5–5.2)
Alkaline Phosphatase: 111 U/L (ref 39–117)
Anion gap: 7 (ref 5–15)
BUN: 13 mg/dL (ref 6–23)
CHLORIDE: 104 meq/L (ref 96–112)
CO2: 26 mmol/L (ref 19–32)
Calcium: 9.3 mg/dL (ref 8.4–10.5)
Creatinine, Ser: 1.37 mg/dL — ABNORMAL HIGH (ref 0.50–1.35)
GFR calc Af Amer: 61 mL/min — ABNORMAL LOW (ref >90)
GFR calc non Af Amer: 53 mL/min — ABNORMAL LOW (ref >90)
GLUCOSE: 149 mg/dL — AB (ref 70–99)
Potassium: 3.6 mmol/L (ref 3.5–5.1)
SODIUM: 137 mmol/L (ref 135–145)
TOTAL PROTEIN: 6.6 g/dL (ref 6.0–8.3)
Total Bilirubin: 0.5 mg/dL (ref 0.3–1.2)

## 2014-08-17 LAB — I-STAT TROPONIN, ED: Troponin i, poc: 0.01 ng/mL (ref 0.00–0.08)

## 2014-08-17 LAB — URINALYSIS, ROUTINE W REFLEX MICROSCOPIC
Bilirubin Urine: NEGATIVE
GLUCOSE, UA: NEGATIVE mg/dL
Hgb urine dipstick: NEGATIVE
KETONES UR: NEGATIVE mg/dL
LEUKOCYTES UA: NEGATIVE
NITRITE: NEGATIVE
PH: 6 (ref 5.0–8.0)
PROTEIN: NEGATIVE mg/dL
SPECIFIC GRAVITY, URINE: 1.014 (ref 1.005–1.030)
Urobilinogen, UA: 0.2 mg/dL (ref 0.0–1.0)

## 2014-08-17 LAB — LIPASE, BLOOD: Lipase: 24 U/L (ref 11–59)

## 2014-08-17 MED ORDER — HYDROMORPHONE HCL 1 MG/ML IJ SOLN
1.0000 mg | Freq: Once | INTRAMUSCULAR | Status: AC
  Administered 2014-08-17: 1 mg via INTRAVENOUS
  Filled 2014-08-17: qty 1

## 2014-08-17 MED ORDER — SODIUM CHLORIDE 0.9 % IV SOLN
INTRAVENOUS | Status: DC
Start: 2014-08-18 — End: 2014-08-17
  Administered 2014-08-17: 03:00:00 via INTRAVENOUS

## 2014-08-17 MED ORDER — SODIUM CHLORIDE 0.9 % IV BOLUS (SEPSIS)
250.0000 mL | Freq: Once | INTRAVENOUS | Status: AC
  Administered 2014-08-17: 250 mL via INTRAVENOUS

## 2014-08-17 MED ORDER — ONDANSETRON HCL 4 MG/2ML IJ SOLN
4.0000 mg | Freq: Once | INTRAMUSCULAR | Status: AC
  Administered 2014-08-17: 4 mg via INTRAVENOUS

## 2014-08-17 MED ORDER — IOHEXOL 300 MG/ML  SOLN: 25.0000 mL | Freq: Once | INTRAMUSCULAR | Status: DC | PRN

## 2014-08-17 MED ORDER — ONDANSETRON 4 MG PO TBDP: 4.0000 mg | ORAL_TABLET | Freq: Three times a day (TID) | ORAL | Status: AC | PRN

## 2014-08-17 MED ORDER — ONDANSETRON HCL 4 MG/2ML IJ SOLN
4.0000 mg | Freq: Once | INTRAMUSCULAR | Status: AC
  Administered 2014-08-17: 4 mg via INTRAVENOUS
  Filled 2014-08-17: qty 2

## 2014-08-17 MED ORDER — IOHEXOL 300 MG/ML  SOLN
100.0000 mL | Freq: Once | INTRAMUSCULAR | Status: AC | PRN
  Administered 2014-08-17: 100 mL via INTRAVENOUS

## 2014-08-17 NOTE — ED Notes (Signed)
Patient transported to CT 

## 2014-08-17 NOTE — ED Provider Notes (Signed)
CSN: [id number]     Arrival date & time 08/17/14  0146 History  This chart was scribed for Vanetta Mulders, MD by Annye Asa, ED Scribe. This patient was seen in room A11C/A11C and the patient's care was started at 3:05 AM.    Chief Complaint  Patient presents with  . Abdominal Pain   Patient is a 63 y.o. male presenting with abdominal pain. The history is provided by the patient. No language interpreter was used.  Abdominal Pain Pain location:  Generalized Pain radiates to:  Does not radiate Pain severity:  Moderate Onset quality:  Gradual Duration:  24 hours Timing:  Constant Progression:  Unchanged Chronicity:  New Relieved by:  None tried Worsened by:  Nothing tried Ineffective treatments:  None tried Associated symptoms: diarrhea, dysuria, fever, nausea, shortness of breath and vomiting   Associated symptoms: no chest pain, no chills, no cough, no hematuria and no sore throat   Diarrhea:    Quality:  Watery   Number of occurrences:  3   Severity:  Mild   Duration:  1 day   Timing:  Intermittent   Progression:  Unchanged Vomiting:    Emesis appearance: streaks of bloosd.   Number of occurrences:  2   Severity:  Mild   Duration:  1 day   Timing:  Intermittent   Progression:  Unchanged    HPI Comments: Gregory Lam is a 63 y.o. male who presents to the Emergency Department complaining of vomiting (2x with streaks of blood beginning at 18:30 12/30) and diarrhea (3x 12/30), beginning yesterday. He also complains of abdominal pain, beginning 24 hours PTA, with frequent belching. He rates his pain as a 9/10.   He also complains of headache.   Past Medical History  Diagnosis Date  . Bipolar 1 disorder   . Hypertension   . Diabetes mellitus without complication   . COPD (chronic obstructive pulmonary disease)   . Hyperlipemia   . TBI (traumatic brain injury)    Past Surgical History  Procedure Laterality Date  . Hernia repair     No family history on  file. History  Substance Use Topics  . Smoking status: Current Every Day Smoker -- 1.00 packs/day  . Smokeless tobacco: Not on file  . Alcohol Use: No    Review of Systems  Constitutional: Positive for fever. Negative for chills.  HENT: Positive for rhinorrhea. Negative for sore throat.   Eyes: Positive for visual disturbance (Blurred vision).  Respiratory: Positive for shortness of breath. Negative for cough.   Cardiovascular: Positive for leg swelling. Negative for chest pain.  Gastrointestinal: Positive for nausea, vomiting, abdominal pain and diarrhea.  Genitourinary: Positive for dysuria. Negative for frequency and hematuria.  Musculoskeletal: Positive for myalgias. Negative for back pain.  Skin: Negative for rash.  Neurological: Positive for headaches.  Hematological: Bruises/bleeds easily (Blood thinner use).  Psychiatric/Behavioral: Negative for confusion.      Allergies  Depakote; Haldol; Other; Latex; Penicillins; and Thorazine  Home Medications   Prior to Admission medications   Medication Sig Start Date End Date Taking? Authorizing Provider  apixaban (ELIQUIS) 5 MG TABS tablet Take 5 mg by mouth 2 (two) times daily.   Yes Historical Provider, MD  famotidine (PEPCID) 20 MG tablet Inject 20 mg into the skin every 12 (twelve) hours.   Yes Historical Provider, MD  gabapentin (NEURONTIN) 400 MG capsule Take 400 mg by mouth 3 (three) times daily.   Yes Historical Provider, MD  lamoTRIgine (LAMICTAL) 25 MG tablet  Take 75 mg by mouth 2 (two) times daily.   Yes Historical Provider, MD  liothyronine (CYTOMEL) 25 MCG tablet Take 25 mcg by mouth daily.   Yes Historical Provider, MD  lithium carbonate 300 MG capsule Inject 600 mg into the skin every 12 (twelve) hours.   Yes Historical Provider, MD  lurasidone (LATUDA) 80 MG TABS tablet Take 80 mg by mouth every evening.   Yes Historical Provider, MD  zolpidem (AMBIEN) 10 MG tablet Take 10 mg by mouth at bedtime as needed for  sleep.   Yes Historical Provider, MD  ondansetron (ZOFRAN ODT) 4 MG disintegrating tablet Take 1 tablet (4 mg total) by mouth every 8 (eight) hours as needed for nausea or vomiting. 08/17/14   Vanetta Mulders, MD   BP 151/85 mmHg  Pulse 87  Temp(Src) 97.8 F (36.6 C) (Oral)  Resp 18  SpO2 97% Physical Exam  Constitutional: He is oriented to person, place, and time. He appears well-developed and well-nourished.  HENT:  Head: Normocephalic and atraumatic.  Mouth/Throat: Oropharynx is clear and moist. No oropharyngeal exudate.  Mucous membranes slightly dry  Eyes: EOM are normal. Pupils are equal, round, and reactive to light.  Sclera are mildly erythematous; no discharge  Cardiovascular: Normal rate, regular rhythm and normal heart sounds.  Exam reveals no gallop and no friction rub.   No murmur heard. Pulmonary/Chest: Effort normal and breath sounds normal. No respiratory distress. He has no wheezes. He has no rales.  Abdominal: Soft. Bowel sounds are normal. There is no tenderness. There is no rebound and no guarding.  Genitourinary: Guaiac negative stool.  stool without gross blood.  Musculoskeletal: Normal range of motion. He exhibits no edema.  Neurological: He is alert and oriented to person, place, and time.  Skin: Skin is warm and dry. No rash noted.  Chronic skin changes to the front of BLE; eczema-like rash on chest  Psychiatric: He has a normal mood and affect. His behavior is normal.  Nursing note and vitals reviewed.   ED Course  Procedures   DIAGNOSTIC STUDIES: Oxygen Saturation is 94% on RA, low by my interpretation.    COORDINATION OF CARE: 3:13 AM Discussed treatment plan with pt at bedside and pt agreed to plan.   Labs Review Labs Reviewed  CBC WITH DIFFERENTIAL - Abnormal; Notable for the following:    WBC 10.8 (*)    MCHC 36.2 (*)    All other components within normal limits  COMPREHENSIVE METABOLIC PANEL - Abnormal; Notable for the following:     Glucose, Bld 149 (*)    Creatinine, Ser 1.37 (*)    GFR calc non Af Amer 53 (*)    GFR calc Af Amer 61 (*)    All other components within normal limits  I-STAT CHEM 8, ED - Abnormal; Notable for the following:    Glucose, Bld 142 (*)    All other components within normal limits  LIPASE, BLOOD  URINALYSIS, ROUTINE W REFLEX MICROSCOPIC  I-STAT TROPOININ, ED   Results for orders placed or performed during the hospital encounter of 08/17/14  CBC with Differential  Result Value Ref Range   WBC 10.8 (H) 4.0 - 10.5 K/uL   RBC 4.46 4.22 - 5.81 MIL/uL   Hemoglobin 14.5 13.0 - 17.0 g/dL   HCT 14.7 82.9 - 56.2 %   MCV 89.9 78.0 - 100.0 fL   MCH 32.5 26.0 - 34.0 pg   MCHC 36.2 (H) 30.0 - 36.0 g/dL   RDW 12.8  11.5 - 15.5 %   Platelets 321 150 - 400 K/uL   Neutrophils Relative % 68 43 - 77 %   Neutro Abs 7.4 1.7 - 7.7 K/uL   Lymphocytes Relative 20 12 - 46 %   Lymphs Abs 2.2 0.7 - 4.0 K/uL   Monocytes Relative 8 3 - 12 %   Monocytes Absolute 0.8 0.1 - 1.0 K/uL   Eosinophils Relative 3 0 - 5 %   Eosinophils Absolute 0.3 0.0 - 0.7 K/uL   Basophils Relative 1 0 - 1 %   Basophils Absolute 0.1 0.0 - 0.1 K/uL  Comprehensive metabolic panel  Result Value Ref Range   Sodium 137 135 - 145 mmol/L   Potassium 3.6 3.5 - 5.1 mmol/L   Chloride 104 96 - 112 mEq/L   CO2 26 19 - 32 mmol/L   Glucose, Bld 149 (H) 70 - 99 mg/dL   BUN 13 6 - 23 mg/dL   Creatinine, Ser 1.611.37 (H) 0.50 - 1.35 mg/dL   Calcium 9.3 8.4 - 09.610.5 mg/dL   Total Protein 6.6 6.0 - 8.3 g/dL   Albumin 3.6 3.5 - 5.2 g/dL   AST 22 0 - 37 U/L   ALT 27 0 - 53 U/L   Alkaline Phosphatase 111 39 - 117 U/L   Total Bilirubin 0.5 0.3 - 1.2 mg/dL   GFR calc non Af Amer 53 (L) >90 mL/min   GFR calc Af Amer 61 (L) >90 mL/min   Anion gap 7 5 - 15  Lipase, blood  Result Value Ref Range   Lipase 24 11 - 59 U/L  Urinalysis, Routine w reflex microscopic  Result Value Ref Range   Color, Urine YELLOW YELLOW   APPearance CLEAR CLEAR   Specific  Gravity, Urine 1.014 1.005 - 1.030   pH 6.0 5.0 - 8.0   Glucose, UA NEGATIVE NEGATIVE mg/dL   Hgb urine dipstick NEGATIVE NEGATIVE   Bilirubin Urine NEGATIVE NEGATIVE   Ketones, ur NEGATIVE NEGATIVE mg/dL   Protein, ur NEGATIVE NEGATIVE mg/dL   Urobilinogen, UA 0.2 0.0 - 1.0 mg/dL   Nitrite NEGATIVE NEGATIVE   Leukocytes, UA NEGATIVE NEGATIVE  I-stat troponin, ED (only if pt is 64 y.o. or older & pain is above umbilicus) - [facility]  Result Value Ref Range   Troponin i, poc 0.01 0.00 - 0.08 ng/mL   Comment 3          I-Stat Chem 8, ED  Result Value Ref Range   Sodium 139 135 - 145 mmol/L   Potassium 3.5 3.5 - 5.1 mmol/L   Chloride 102 96 - 112 mEq/L   BUN 15 6 - 23 mg/dL   Creatinine, Ser 0.451.20 0.50 - 1.35 mg/dL   Glucose, Bld 409142 (H) 70 - 99 mg/dL   Calcium, Ion 8.111.25 9.141.13 - 1.30 mmol/L   TCO2 21 0 - 100 mmol/L   Hemoglobin 14.3 13.0 - 17.0 g/dL   HCT 78.242.0 95.639.0 - 21.352.0 %     Imaging Review Dg Chest 2 View  08/17/2014   CLINICAL DATA:  Shortness of breath.  Headache and abdominal pain.  EXAM: CHEST  2 VIEW  COMPARISON:  None.  FINDINGS: The posterior costophrenic sulci are excluded from view but a significant effusion should not missed.  Normal heart size and mediastinal contours. No acute infiltrate or edema. No effusion or pneumothorax. No acute osseous findings.  IMPRESSION: No active cardiopulmonary disease.   Electronically Signed   By: Christiane HaJonathan  Watts M.D.   On: 08/17/2014 03:53   Ct Head Wo Contrast  08/17/2014   CLINICAL DATA:  Headache  EXAM: CT HEAD WITHOUT CONTRAST  TECHNIQUE: Contiguous axial images were obtained from the base of the skull through the vertex without intravenous contrast.  COMPARISON:  None.  FINDINGS: Skull and Sinuses:No fracture or destructive process.  Scattered mucosal thickening in the paranasal sinuses and nasal cavity. No sinus effusion.  Orbits: Right cataract resection.  No acute findings.  Brain: No evidence of acute infarction,  hemorrhage, hydrocephalus, or mass lesion/mass effect. Mild generalized cerebral volume loss.  IMPRESSION: 1. No intracranial findings to explain headache. 2. Chronic sinusitis.   Electronically Signed   By: Tiburcio PeaJonathan  Watts M.D.   On: 08/17/2014 05:32   Ct Abdomen Pelvis W Contrast  08/17/2014   CLINICAL DATA:  Vomiting and diarrhea  EXAM: CT ABDOMEN AND PELVIS WITH CONTRAST  TECHNIQUE: Multidetector CT imaging of the abdomen and pelvis was performed using the standard protocol following bolus administration of intravenous contrast.  CONTRAST:  100mL OMNIPAQUE IOHEXOL 300 MG/ML  SOLN  COMPARISON:  None.  FINDINGS: BODY WALL: Fatty left inguinal hernia.  LOWER CHEST: Unremarkable.  ABDOMEN/PELVIS:  Liver: Probable mild diffuse steatosis.  No focal abnormality.  Biliary: No evidence of biliary obstruction or stone.  Pancreas: Coarse calcification in or next to (as in a peripancreatic node) the pancreatic head. This is consistent with a remote inflammatory process. No active inflammation or noncalcified mass identified. No ductal enlargement.  Spleen: Unremarkable.  Adrenals: Unremarkable.  Kidneys and ureters: The kidneys are mildly small and the cortex is diffusely heterogeneously enhancing with suggestion of multiple microcysts. This is not a striated nephrogram pattern, especially when correlated with delayed imaging. Urinalysis is currently negative. No calcifications. No hydronephrosis.  Bladder: Unremarkable.  Reproductive: Unremarkable.  Bowel: No bowel obstruction or inflammatory wall thickening. There is mild distal colonic diverticulosis. Normal appendix. Bowel adhesive changes are noted with small bowel closely applied to the ventral abdominal wall.  Retroperitoneum: No mass or adenopathy.  Peritoneum: No ascites or pneumoperitoneum.  Vascular: No acute abnormality.  OSSEOUS: No acute abnormalities.  IMPRESSION: 1. No acute intra-abdominal disease. 2. 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   Electronically Signed   By: Tiburcio PeaJonathan  Watts M.D.   On: 08/17/2014 05:41     EKG Interpretation None      MDM   Final diagnoses:  SOB (shortness of breath)  Abdominal pain  Headache  Hematemesis with nausea   Patient presenting with several complaints. Talking about having some diarrhea, about having some nausea and vomiting with streaks of blood never had a pool of blood. Hemodynamically stable here hemoglobin hematocrit is stable. Rectal exam is negative for any blood.  Patient also with complaint of headache. Head CT was negative. Abdominal CT was negative. Patient was significant improvement. Nausea and vomiting and some of the diarrhea may have been due to above. Patient with complaint of generalized abdominal pain which preexisted the nausea and vomiting however no significant findings. Patient can be discharged back to the [facility] assisted living with precautions. Patient will be started on Zofran.  I personally performed the services described in this documentation, which was scribed in my presence. The recorded information has been reviewed and is accurate.       Vanetta MuldersScott Everest Brod, MD 08/17/14 [phone]

## 2014-08-17 NOTE — ED Notes (Addendum)
Pt arrives via EMS with c/o abd pain from [facility] assisted [facility], /v/D. Started yesterday after eating [place]. Recent hosp admission. Given 650 MG tylenol by EMS PTA, states he felt warm to the touch. Oral temp 97.8

## 2014-08-17 NOTE — ED Notes (Signed)
MD at bedside. 

## 2014-08-17 NOTE — ED Notes (Signed)
Pt called out stating that he needed to use the bathroom. This RN went to assist the pt to stand. Pt attempted to stand, and stated "I feel like i'm going to pass out again". Pt sat back down on bed, and stated that he would wait to use the bathroom until he felt more steady. Pt also request\ing pain medication.

## 2014-08-17 NOTE — ED Notes (Signed)
CT called and informed pt has finished his contrast.  

## 2014-08-17 NOTE — Discharge Instructions (Signed)
Take Zofran as needed. Return for any new or worse symptoms. In particular return for vomiting of more blood or poles of blood. Today's workup was negative. Your blood counts are normal.

## 2014-12-07 ENCOUNTER — Emergency Department (HOSPITAL_COMMUNITY)
Admission: EM | Admit: 2014-12-07 | Discharge: 2014-12-08 | Disposition: A | Discharge status: Home / Self Care | Payer: Medicare Other | Attending: Emergency Medicine | Admitting: Emergency Medicine

## 2014-12-07 ENCOUNTER — Encounter (HOSPITAL_COMMUNITY): Payer: Self-pay | Admitting: Emergency Medicine

## 2014-12-07 DIAGNOSIS — Z7901 Long term (current) use of anticoagulants: Secondary | ICD-10-CM | POA: Insufficient documentation

## 2014-12-07 DIAGNOSIS — Z880 Allergy status to penicillin: Secondary | ICD-10-CM | POA: Diagnosis not present

## 2014-12-07 DIAGNOSIS — E119 Type 2 diabetes mellitus without complications: Secondary | ICD-10-CM | POA: Insufficient documentation

## 2014-12-07 DIAGNOSIS — F319 Bipolar disorder, unspecified: Secondary | ICD-10-CM | POA: Insufficient documentation

## 2014-12-07 DIAGNOSIS — Z79899 Other long term (current) drug therapy: Secondary | ICD-10-CM | POA: Diagnosis not present

## 2014-12-07 DIAGNOSIS — Z87820 Personal history of traumatic brain injury: Secondary | ICD-10-CM | POA: Diagnosis not present

## 2014-12-07 DIAGNOSIS — J441 Chronic obstructive pulmonary disease with (acute) exacerbation: Secondary | ICD-10-CM | POA: Diagnosis not present

## 2014-12-07 DIAGNOSIS — Z91040 Latex allergy status: Secondary | ICD-10-CM | POA: Diagnosis not present

## 2014-12-07 DIAGNOSIS — I10 Essential (primary) hypertension: Secondary | ICD-10-CM | POA: Diagnosis not present

## 2014-12-07 DIAGNOSIS — R0602 Shortness of breath: Secondary | ICD-10-CM | POA: Diagnosis present

## 2014-12-07 LAB — CBC
HCT: 44.6 % (ref 39.0–52.0)
Hemoglobin: 15.7 g/dL (ref 13.0–17.0)
MCH: 32.6 pg (ref 26.0–34.0)
MCHC: 35.2 g/dL (ref 30.0–36.0)
MCV: 92.5 fL (ref 78.0–100.0)
PLATELETS: 233 10*3/uL (ref 150–400)
RBC: 4.82 MIL/uL (ref 4.22–5.81)
RDW: 14 % (ref 11.5–15.5)
WBC: 11.1 10*3/uL — AB (ref 4.0–10.5)

## 2014-12-07 MED ORDER — IPRATROPIUM-ALBUTEROL 0.5-2.5 (3) MG/3ML IN SOLN
3.0000 mL | Freq: Once | RESPIRATORY_TRACT | Status: AC
  Administered 2014-12-08: 3 mL via RESPIRATORY_TRACT
  Filled 2014-12-07: qty 3

## 2014-12-07 NOTE — ED Notes (Signed)
[facility] about three hours ago. Denies cough, CP, N/V. Hx COPD. LS clear.

## 2014-12-07 NOTE — ED Provider Notes (Signed)
CSN: [id number]     Arrival date & time 12/07/14  2325 History   First MD Initiated Contact with Patient 12/07/14 2337     Chief Complaint  Patient presents with  . Shortness of Breath     (Consider location/radiation/quality/duration/timing/severity/associated sxs/prior Treatment) HPI Comments: Patient is a 64 year old male past medical history significant for bipolar 1 disorder, hypertension, DM, COPD, hyperlipidemia, TBI, chronic anticoagulation for previous DVTs presenting to the emergency department for evaluation of acute onset shortness of breath with cough, nasal congestion. He states his symptoms started about 3 hours prior to arrival. He states he has had a week or so of nasal congestion. He endorses this feels like previous COPD exacerbations. He denies having any inhalers or other medications at home for COPD. No modifying factors identified. Denies any fevers, chills, chest pain, vomiting, diarrhea, abdominal pain, lower extremity swelling.   Past Medical History  Diagnosis Date  . Bipolar 1 disorder   . Hypertension   . Diabetes mellitus without complication   . COPD (chronic obstructive pulmonary disease)   . Hyperlipemia   . TBI (traumatic brain injury)    Past Surgical History  Procedure Laterality Date  . Hernia repair     No family history on file. History  Substance Use Topics  . Smoking status: Current Every Day Smoker -- 1.00 packs/day  . Smokeless tobacco: Not on file  . Alcohol Use: No    Review of Systems  HENT: Positive for congestion and rhinorrhea. Negative for sinus pressure.   Respiratory: Positive for cough and shortness of breath.   All other systems reviewed and are negative.     Allergies  Depakote; Haldol; Other; Latex; Penicillins; and Thorazine  Home Medications   Prior to Admission medications   Medication Sig Start Date End Date Taking? Authorizing Provider  apixaban (ELIQUIS) 5 MG TABS tablet Take 5 mg by mouth 2 (two) times  daily.    Historical Provider, MD  famotidine (PEPCID) 20 MG tablet Inject 20 mg into the skin every 12 (twelve) hours.    Historical Provider, MD  gabapentin (NEURONTIN) 400 MG capsule Take 400 mg by mouth 3 (three) times daily.    Historical Provider, MD  lamoTRIgine (LAMICTAL) 25 MG tablet Take 75 mg by mouth 2 (two) times daily.    Historical Provider, MD  liothyronine (CYTOMEL) 25 MCG tablet Take 25 mcg by mouth daily.    Historical Provider, MD  lithium carbonate 300 MG capsule Inject 600 mg into the skin every 12 (twelve) hours.    Historical Provider, MD  lurasidone (LATUDA) 80 MG TABS tablet Take 80 mg by mouth every evening.    Historical Provider, MD  ondansetron (ZOFRAN ODT) 4 MG disintegrating tablet Take 1 tablet (4 mg total) by mouth every 8 (eight) hours as needed for nausea or vomiting. 08/17/14   Vanetta Mulders, MD  zolpidem (AMBIEN) 10 MG tablet Take 10 mg by mouth at bedtime as needed for sleep.    Historical Provider, MD   BP 128/70 mmHg  Pulse 69  Temp(Src) 98 F (36.7 C) (Oral)  Resp 19  Ht 6' (1.829 m)  Wt 215 lb (97.523 kg)  BMI 29.15 kg/m2  SpO2 96% Physical Exam  Constitutional: He is oriented to person, place, and time. He appears well-developed and well-nourished. No distress.  HENT:  Head: Normocephalic and atraumatic.  Right Ear: External ear normal.  Left Ear: External ear normal.  Nose: Nose normal.  Mouth/Throat: Oropharynx is clear and moist. No  oropharyngeal exudate.  Eyes: Conjunctivae are normal.  Neck: Neck supple.  Cardiovascular: Normal rate, regular rhythm and normal heart sounds.   Pulmonary/Chest: Effort normal. No accessory muscle usage. No tachypnea and no bradypnea. No respiratory distress. He has no decreased breath sounds. He has wheezes (expiratory). He has no rhonchi. He has no rales. He exhibits no tenderness.  Abdominal: Soft. There is no tenderness.  Musculoskeletal: Normal range of motion. He exhibits no edema.  Neurological: He  is alert and oriented to person, place, and time.  Skin: Skin is warm and dry. He is not diaphoretic.  Nursing note and vitals reviewed.   ED Course  Procedures (including critical care time) Medications  ipratropium-albuterol (DUONEB) 0.5-2.5 (3) MG/3ML nebulizer solution 3 mL (3 mLs Nebulization Given 12/08/14 0030)  albuterol (PROVENTIL HFA;VENTOLIN HFA) 108 (90 BASE) MCG/ACT inhaler 2 puff (2 puffs Inhalation Given 12/08/14 0134)    Labs Review Labs Reviewed  CBC - Abnormal; Notable for the following:    WBC 11.1 (*)    All other components within normal limits  BASIC METABOLIC PANEL - Abnormal; Notable for the following:    Glucose, Bld 118 (*)    Creatinine, Ser 1.42 (*)    GFR calc non Af Amer 51 (*)    GFR calc Af Amer 59 (*)    All other components within normal limits  TROPONIN I  I-STAT TROPOININ, ED    Imaging Review Dg Chest 2 View (if Patient Has Fever And/or Copd)  12/08/2014   CLINICAL DATA:  Shortness of breath.  EXAM: CHEST  2 VIEW  COMPARISON:  08/17/2014  FINDINGS: Cardiomediastinal silhouette is within normal limits. Thoracic aortic calcification is noted. The lungs are normally to mildly hyperinflated. Mild prominence of the interstitial markings is unchanged. No confluent airspace opacity, pulmonary edema, pleural effusion, or pneumothorax is identified. No acute osseous abnormality is seen.  IMPRESSION: No active cardiopulmonary disease.   Electronically Signed   By: Sebastian AcheAllen  Grady   On: 12/08/2014 00:16     EKG Interpretation   Date/Time:  Thursday December 07 2014 23:33:43 EDT Ventricular Rate:  71 PR Interval:  200 QRS Duration: 83 QT Interval:  549 QTC Calculation: 597 R Axis:   68 Text Interpretation:  Sinus rhythm Nonspecific T abnormalities, lateral  leads Prolonged QT interval Baseline wander in lead(s) V3 Confirmed by  Fayrene FearingJAMES  MD, MARK ([id number]) on 12/08/2014 12:12:38 AM      MDM   Final diagnoses:  COPD exacerbation    Filed Vitals:    12/08/14 0115  BP: 128/70  Pulse: 69  Temp:   Resp: 19   Afebrile, NAD, non-toxic appearing, AAOx4.   I have reviewed nursing notes, vital signs, and all appropriate lab and imaging results for this patient.  Patient presenting with shortness of breath with nasal congestion, rhinorrhea, cough. No signs of respiratory distress on examination. Expiratory wheeze appreciated along with nasal congestion and rhinorrhea physical exam. Cardiac exam is otherwise well. No lower extremity edema noted. Labs reviewed without acute abnormality. Chest x-ray reviewed without acute cardiopulmonary findings. Symptoms improved after DuoNeb administration. No hypoxia, tachycardia or tachypnea to suggest pneumonia or PE. Likely COPD exacerbation. Will discharge home with symptomatic measures. Return precautions discussed. Patient is agreeable to plan. Patient is stable at time of discharge Patient d/w with Dr. Fayrene FearingJames, agrees with plan.      Francee PiccoloJennifer Sabrie Moritz, PA-C 12/08/14 [id number]  Rolland PorterMark James, MD 12/09/14 [phone]

## 2014-12-08 ENCOUNTER — Emergency Department (HOSPITAL_COMMUNITY): Payer: Medicare Other

## 2014-12-08 DIAGNOSIS — J441 Chronic obstructive pulmonary disease with (acute) exacerbation: Secondary | ICD-10-CM | POA: Diagnosis not present

## 2014-12-08 LAB — BASIC METABOLIC PANEL
Anion gap: 7 (ref 5–15)
BUN: 14 mg/dL (ref 6–23)
CO2: 26 mmol/L (ref 19–32)
Calcium: 9.9 mg/dL (ref 8.4–10.5)
Chloride: 107 mmol/L (ref 96–112)
Creatinine, Ser: 1.42 mg/dL — ABNORMAL HIGH (ref 0.50–1.35)
GFR calc Af Amer: 59 mL/min — ABNORMAL LOW (ref >90)
GFR, EST NON AFRICAN AMERICAN: 51 mL/min — AB (ref >90)
Glucose, Bld: 118 mg/dL — ABNORMAL HIGH (ref 70–99)
Potassium: 3.8 mmol/L (ref 3.5–5.1)
SODIUM: 140 mmol/L (ref 135–145)

## 2014-12-08 LAB — TROPONIN I

## 2014-12-08 LAB — I-STAT TROPONIN, ED: TROPONIN I, POC: 0 ng/mL (ref 0.00–0.08)

## 2014-12-08 MED ORDER — ALBUTEROL SULFATE HFA 108 (90 BASE) MCG/ACT IN AERS
2.0000 | INHALATION_SPRAY | Freq: Once | RESPIRATORY_TRACT | Status: AC
  Administered 2014-12-08: 2 via RESPIRATORY_TRACT
  Filled 2014-12-08: qty 6.7

## 2014-12-08 MED ORDER — IPRATROPIUM-ALBUTEROL 0.5-2.5 (3) MG/3ML IN SOLN
RESPIRATORY_TRACT | Status: AC
  Filled 2014-12-08: qty 3

## 2014-12-08 NOTE — Discharge Instructions (Signed)
Please follow up with your primary care physician in 1-2 days. If you do not have one please call the [facility] number listed above.Please have your doctor recheck your kidney function in the next week. Please read all discharge instructions and return precautions.   Chronic Obstructive Pulmonary Disease Chronic obstructive pulmonary disease (COPD) is a common lung condition in which airflow from the lungs is limited. COPD is a general term that can be used to describe many different lung problems that limit airflow, including both chronic bronchitis and emphysema. If you have COPD, your lung function will probably never return to normal, but there are measures you can take to improve lung function and make yourself feel better.  CAUSES   Smoking (common).   Exposure to secondhand smoke.   Genetic problems.  Chronic inflammatory lung diseases or recurrent infections. SYMPTOMS   Shortness of breath, especially with physical activity.   Deep, persistent (chronic) cough with a large amount of thick mucus.   Wheezing.   Rapid breaths (tachypnea).   Gray or bluish discoloration (cyanosis) of the skin, especially in fingers, toes, or lips.   Fatigue.   Weight loss.   Frequent infections or episodes when breathing symptoms become much worse (exacerbations).   Chest tightness. DIAGNOSIS  Your health care provider will take a medical history and perform a physical examination to make the initial diagnosis. Additional tests for COPD may include:   Lung (pulmonary) function tests.  Chest X-ray.  CT scan.  Blood tests. TREATMENT  Treatment available to help you feel better when you have COPD includes:   Inhaler and nebulizer medicines. These help manage the symptoms of COPD and make your breathing more comfortable.  Supplemental oxygen. Supplemental oxygen is only helpful if you have a low oxygen level in your blood.   Exercise and physical  activity. These are beneficial for nearly all people with COPD. Some people may also benefit from a pulmonary rehabilitation program. HOME CARE INSTRUCTIONS   Take all medicines (inhaled or pills) as directed by your health care provider.  Avoid over-the-counter medicines or cough syrups that dry up your airway (such as antihistamines) and slow down the elimination of secretions unless instructed otherwise by your health care provider.   If you are a smoker, the most important thing that you can do is stop smoking. Continuing to smoke will cause further lung damage and breathing trouble. Ask your health care provider for help with quitting smoking. He or she can direct you to community resources or hospitals that provide support.  Avoid exposure to irritants such as smoke, chemicals, and fumes that aggravate your breathing.  Use oxygen therapy and pulmonary rehabilitation if directed by your health care provider. If you require home oxygen therapy, ask your health care provider whether you should purchase a pulse oximeter to measure your oxygen level at home.   Avoid contact with individuals who have a contagious illness.  Avoid extreme temperature and humidity changes.  Eat healthy foods. Eating smaller, more frequent meals and resting before meals may help you maintain your strength.  Stay active, but balance activity with periods of rest. Exercise and physical activity will help you maintain your ability to do things you want to do.  Preventing infection and hospitalization is very important when you have COPD. Make sure to receive all the vaccines your health care provider recommends, especially the pneumococcal and influenza vaccines. Ask your health care provider whether you need a pneumonia vaccine.  Learn  and use relaxation techniques to manage stress.  Learn and use controlled breathing techniques as directed by your health care provider. Controlled breathing techniques include:    Pursed lip breathing. Start by breathing in (inhaling) through your nose for 1 second. Then, purse your lips as if you were going to whistle and breathe out (exhale) through the pursed lips for 2 seconds.   Diaphragmatic breathing. Start by putting one hand on your abdomen just above your waist. Inhale slowly through your nose. The hand on your abdomen should move out. Then purse your lips and exhale slowly. You should be able to feel the hand on your abdomen moving in as you exhale.   Learn and use controlled coughing to clear mucus from your lungs. Controlled coughing is a series of short, progressive coughs. The steps of controlled coughing are:  1. Lean your head slightly forward.  2. Breathe in deeply using diaphragmatic breathing.  3. Try to hold your breath for 3 seconds.  4. Keep your mouth slightly open while coughing twice.  5. Spit any mucus out into a tissue.  6. Rest and repeat the steps once or twice as needed. SEEK MEDICAL CARE IF:   You are coughing up more mucus than usual.   There is a change in the color or thickness of your mucus.   Your breathing is more labored than usual.   Your breathing is faster than usual.  SEEK IMMEDIATE MEDICAL CARE IF:   You have shortness of breath while you are resting.   You have shortness of breath that prevents you from:  Being able to talk.   Performing your usual physical activities.   You have chest pain lasting longer than 5 minutes.   Your skin color is more cyanotic than usual.  You measure low oxygen saturations for longer than 5 minutes with a pulse oximeter. MAKE SURE YOU:   Understand these instructions.  Will watch your condition.  Will get help right away if you are not doing well or get worse. Document Released: 05/14/2005 Document Revised: 12/19/2013 Document Reviewed: 03/31/2013 [facility] Patient Information 2015 [place], [place]. This information is not intended to replace advice given to  you by your health care provider. Make sure you discuss any questions you have with your health care provider.

## 2014-12-08 NOTE — ED Notes (Signed)
Called [facility] for Transport  °

## 2015-01-21 DIAGNOSIS — I82401 Acute embolism and thrombosis of unspecified deep veins of right lower extremity: Secondary | ICD-10-CM

## 2015-01-21 NOTE — ED Notes (Signed)
I have reviewed discharge instructions with the patient.  The patient verbalized understanding.

## 2015-01-21 NOTE — ED Provider Notes (Signed)
HPI Comments: 64 year old male evaluated at [facility] in [place] on [place] 3, reveals a nonocclusive thrombus in the right popliteal vein, which extends into the posterior tibial veins and a peroneal vein as well.    Patient reports that he was given a Xarelto prescription and  pain medication, but was unable to get either of these medications filled in [place] and is thus has moved back to [place] and is staying with his sister.  He has not had any Xarelto or  pain medication since he was in the hospital on the third of June    Patient is currently without a primary care provider here as well.,  Although he has been seen in the remote past at [facility]    Patient is a 64 y.o. male presenting with leg pain. The history is provided by the patient.   Leg Pain   This is a new problem. The current episode started more than 2 days ago. The problem occurs constantly. The problem has been gradually worsening. The pain is present in the right lower leg. The quality of the pain is described as aching and pounding. The pain is moderate. Associated symptoms include stiffness and tingling. Pertinent negatives include no numbness, no back pain and no neck pain. The symptoms are aggravated by activity, standing and movement. He has tried nothing for the symptoms. There has been no history of extremity trauma.        Past Medical History:   Diagnosis Date   ??? Hypertension    ??? Thromboembolus (HCC)    ??? COPD      emphysema   ??? Asthma    ??? Cancer (HCC)      skin   ??? Sleep disorder    ??? Psychiatric disorder      bipolar   ??? Diabetes (HCC)      NIDDM   ??? Other ill-defined conditions(799.89)      DVT ble/ lue       Past Surgical History:   Procedure Laterality Date   ??? Pr cardiac surg procedure unlist       heart cath 2007         History reviewed. No pertinent family history.    History     Social History   ??? Marital Status: DIVORCED     Spouse Name: N/A    ??? Number of Children: N/A   ??? Years of Education: N/A     Occupational History   ??? Not on file.     Social History Main Topics   ??? Smoking status: Current Every Day Smoker -- 2.00 packs/day   ??? Smokeless tobacco: Not on file   ??? Alcohol Use: Yes   ??? Drug Use: Yes     Special: Marijuana   ??? Sexual Activity: Not on file     Other Topics Concern   ??? Not on file     Social History Narrative         ALLERGIES: Latex, natural rubber and Haldol    Review of Systems   Constitutional: Negative for fever, chills, diaphoresis and activity change.   HENT: Negative for dental problem, hearing loss, nosebleeds, rhinorrhea and sore throat.    Eyes: Negative for pain, discharge, redness and visual disturbance.   Respiratory: Negative for apnea, cough, chest tightness, shortness of breath and wheezing.    Cardiovascular: Negative for chest pain, palpitations and leg swelling.   Gastrointestinal: Negative for nausea,  vomiting, abdominal pain, diarrhea and constipation.   Endocrine: Negative for cold intolerance, heat intolerance, polydipsia and polyuria.   Genitourinary: Negative for dysuria and flank pain.   Musculoskeletal: Positive for stiffness. Negative for myalgias, back pain, joint swelling, arthralgias and neck pain.   Skin: Negative for pallor and rash.   Allergic/Immunologic: Negative for environmental allergies and food allergies.   Neurological: Positive for tingling. Negative for dizziness, tremors, light-headedness, numbness and headaches.   Hematological: Negative for adenopathy. Does not bruise/bleed easily.   Psychiatric/Behavioral: Positive for sleep disturbance. Negative for confusion and dysphoric mood. The patient is nervous/anxious. The patient is not hyperactive.    All other systems reviewed and are negative.      Filed Vitals:    01/21/15 2023   BP: 148/91   Pulse: 96   Temp: 98.9 ??F (37.2 ??C)   Resp: 16   Height:  (1.803 m)   Weight: 108.863 kg (240 lb)   SpO2: 96%            Physical Exam    Constitutional: He is oriented to person, place, and time. He appears well-developed and well-nourished. No distress.   HENT:   Head: Normocephalic and atraumatic.   Mouth/Throat: Oropharynx is clear and moist.   Eyes: Conjunctivae and EOM are normal. Pupils are equal, round, and reactive to light. Right eye exhibits no discharge. Left eye exhibits no discharge. No scleral icterus.   Neck: Normal range of motion. Neck supple. No JVD present.   Cardiovascular: Normal rate, regular rhythm, normal heart sounds and intact distal pulses.  Exam reveals no gallop and no friction rub.    No murmur heard.  Pulmonary/Chest: Effort normal and breath sounds normal. No respiratory distress. He has no wheezes.   Abdominal: Soft. Bowel sounds are normal. He exhibits no distension. There is no tenderness. There is no rebound and no guarding.   Musculoskeletal: Normal range of motion. He exhibits no edema or tenderness.   Right calf is swollen compared to the left, mildly tender to palpation diffusely    No erythema or open wounds are found.   Lymphadenopathy:     He has no cervical adenopathy.   Neurological: He is alert and oriented to person, place, and time. He has normal strength. No cranial nerve deficit or sensory deficit. He exhibits normal muscle tone. GCS eye subscore is 4. GCS verbal subscore is 5. GCS motor subscore is 6.   Skin: Skin is warm and dry. No rash noted. He is not diaphoretic. No erythema.   Psychiatric: He has a normal mood and affect. His speech is normal and behavior is normal. Judgment and thought content normal. Cognition and memory are normal.   Nursing note and vitals reviewed.       MDM  Number of Diagnoses or Management Options  Acute deep vein thrombosis (DVT) of right lower extremity, unspecified vein (HCC): established and worsening  Diagnosis management comments:   Pain control with Norco   the patient is medically stable.  He will need to be restarted on his Xarelto     Social work consulted in am for assistance         Amount and/or Complexity of Data Reviewed  Tests in the medicine section of CPT??: ordered and reviewed  Review and summarize past medical records: yes    Risk of Complications, Morbidity, and/or Mortality  Presenting problems: moderate  Diagnostic procedures: low  Management options: moderate  General comments: Elements of this note have  been dictated via voice recognition software.  Text and phrases may be limited by the accuracy of the software.  The chart has been reviewed, but errors may still be present.      Patient Progress  Patient progress: improved      Procedures

## 2015-01-21 NOTE — ED Notes (Addendum)
Patient states he was diagnosed with a blood clot in his right leg on Friday at a hospital in [place]. States he is having pain in leg and needs relief.

## 2015-01-22 ENCOUNTER — Inpatient Hospital Stay
Admit: 2015-01-22 | Discharge: 2015-01-22 | Disposition: A | Discharge status: Home / Self Care | Payer: MEDICARE | Attending: Emergency Medicine

## 2015-01-22 MED ORDER — RIVAROXABAN 15 MG TAB
15 mg | Freq: Once | ORAL | Status: AC
Start: 2015-01-22 — End: 2015-01-21
  Administered 2015-01-22: 04:00:00 via ORAL

## 2015-01-22 MED ORDER — HYDROCODONE-ACETAMINOPHEN 7.5 MG-325 MG TAB
ORAL_TABLET | ORAL | Status: DC | PRN
Start: 2015-01-22 — End: 2015-03-23

## 2015-01-22 MED ORDER — HYDROCODONE-ACETAMINOPHEN 7.5 MG-325 MG TAB
ORAL | Status: AC
Start: 2015-01-22 — End: 2015-01-21
  Administered 2015-01-22: 04:00:00 via ORAL

## 2015-01-22 MED ORDER — RIVAROXABAN 15 MG (42)-20 MG (9) TABLETS IN A DOSE PACK
15 mg (42)- 20 mg (9) | PACK | ORAL | Status: DC
Start: 2015-01-22 — End: 2015-03-23

## 2015-01-22 MED FILL — HYDROCODONE-ACETAMINOPHEN 7.5 MG-325 MG TAB: ORAL | Qty: 1

## 2015-01-22 MED FILL — XARELTO 15 MG TABLET: 15 mg | ORAL | Qty: 1

## 2015-01-28 ENCOUNTER — Emergency Department: Admit: 2015-01-29 | Payer: MEDICARE | Primary: Family Medicine

## 2015-01-28 DIAGNOSIS — J441 Chronic obstructive pulmonary disease with (acute) exacerbation: Secondary | ICD-10-CM

## 2015-01-28 NOTE — ED Notes (Signed)
I have reviewed discharge instructions with the patient.  The patient verbalized understanding.

## 2015-01-28 NOTE — ED Notes (Signed)
Pt refusing iv and lab work on arrival to ed.

## 2015-01-28 NOTE — ED Provider Notes (Signed)
HPI Comments: Presents with complaint of cough and shortness of breath ??1 day.  Patient has a significant smoking history and states she's been told for 50 years to stop smoking but he really enjoys smoking.  He denies any chest pain.  He denies any fever.  He's been taking his her altered for about one week.   He initially refused to have labs drawn.  He has not been on any steroids or antibiotics recently.  He recently moved here from [place].    Patient is a 64 y.o. male presenting with shortness of breath. The history is provided by the patient.   Shortness of Breath  This is a new problem. The problem occurs continuously.The current episode started 12 to 24 hours ago. The problem has been gradually worsening. Associated symptoms include cough and wheezing. Pertinent negatives include no fever, no sputum production, no chest pain, no abdominal pain, no leg pain and no leg swelling. He has tried beta-agonist inhalers for the symptoms. The treatment provided mild relief. He has had prior ED visits. Associated medical issues include COPD.        Past Medical History:   Diagnosis Date   ??? Hypertension    ??? Thromboembolus (HCC)    ??? COPD      emphysema   ??? Asthma    ??? Cancer (HCC)      skin   ??? Sleep disorder    ??? Psychiatric disorder      bipolar   ??? Diabetes (HCC)      NIDDM   ??? Other ill-defined conditions(799.89)      DVT ble/ lue       Past Surgical History:   Procedure Laterality Date   ??? Pr cardiac surg procedure unlist       heart cath 2007         History reviewed. No pertinent family history.    History     Social History   ??? Marital Status: DIVORCED     Spouse Name: N/A   ??? Number of Children: N/A   ??? Years of Education: N/A     Occupational History   ??? Not on file.     Social History Main Topics   ??? Smoking status: Current Every Day Smoker -- 2.00 packs/day   ??? Smokeless tobacco: Not on file   ??? Alcohol Use: Yes   ??? Drug Use: Yes     Special: Marijuana   ??? Sexual Activity: Not on file      Other Topics Concern   ??? Not on file     Social History Narrative         ALLERGIES: Latex, natural rubber and Haldol    Review of Systems   Constitutional: Negative for fever and chills.   Respiratory: Positive for cough, shortness of breath and wheezing. Negative for sputum production.    Cardiovascular: Negative for chest pain and leg swelling.   Gastrointestinal: Negative for abdominal pain.   All other systems reviewed and are negative.      Filed Vitals:    01/28/15 2032   BP: 149/84   Pulse: 96   Temp: 98.1 ??F (36.7 ??C)   Resp: 20   Height: 5\' 11"  (1.803 m)   Weight: 108.863 kg (240 lb)   SpO2: 97%            Physical Exam   Constitutional: He is oriented to person, place, and time. He appears well-developed and well-nourished. No distress.  HENT:   Head: Normocephalic and atraumatic.   Neck: Normal range of motion. Neck supple.   Cardiovascular: Normal rate.    Pulmonary/Chest: Effort normal. No respiratory distress. He has wheezes. He has no rales.   Abdominal: Soft.   Musculoskeletal: Normal range of motion. He exhibits no edema.   Neurological: He is alert and oriented to person, place, and time.   Skin: Skin is warm and dry. He is not diaphoretic.   Nursing note and vitals reviewed.       MDM  Number of Diagnoses or Management Options  Acute exacerbation of chronic obstructive pulmonary disease (COPD) (HCC):   Tobacco abuse:   Diagnosis management comments: Patient given an hour-long continuous and Solu-Medrol.  His lung sounds improved.  He states he feels better.  Discharge home on steroid taper and Spiriva and albuterol.   Patient reports he lost his inhalers when he moved.       Amount and/or Complexity of Data Reviewed  Clinical lab tests: reviewed and ordered  Independent visualization of images, tracings, or specimens: yes (NSR, no ST elevation, nl QRS)    Risk of Complications, Morbidity, and/or Mortality  Presenting problems: high  Diagnostic procedures: moderate  Management options: high     Patient Progress  Patient progress: improved      Procedures

## 2015-01-28 NOTE — Progress Notes (Signed)
Dr Adele Dan Aware of lactic acid results. Neb tx to follow.

## 2015-01-28 NOTE — ED Notes (Signed)
Pt arrives via [facility] for C/o shortness of breath onset just pta while attempting to lie down for bed. Pt arrives with cough in triage, admits to non productive cough for couple weeks. Denies fever. Pt with hx copd. Per ems pt with sats 97% ra on their arrival however bilateral wheezing. Albuterol given enroute, pt states feeling better on arrival. Pt currently being treated for dvt to right knee. Requesting xarelto and hydrocodone on arrival. +smoking. Refused iv with ems.

## 2015-01-29 ENCOUNTER — Inpatient Hospital Stay
Admit: 2015-01-29 | Discharge: 2015-01-29 | Disposition: A | Discharge status: Home / Self Care | Payer: MEDICARE | Attending: Emergency Medicine

## 2015-01-29 LAB — METABOLIC PANEL, COMPREHENSIVE
A-G Ratio: 0.9 — ABNORMAL LOW (ref 1.2–3.5)
ALT (SGPT): 58 U/L (ref 12–65)
AST (SGOT): 28 U/L (ref 15–37)
Albumin: 3.4 g/dL (ref 3.2–4.6)
Alk. phosphatase: 108 U/L (ref 50–136)
Anion gap: 6 mmol/L — ABNORMAL LOW (ref 7–16)
BUN: 26 MG/DL — ABNORMAL HIGH (ref 8–23)
Bilirubin, total: 0.3 MG/DL (ref 0.2–1.1)
CO2: 27 mmol/L (ref 21–32)
Calcium: 9.6 MG/DL (ref 8.3–10.4)
Chloride: 104 mmol/L (ref 98–107)
Creatinine: 1.35 MG/DL (ref 0.8–1.5)
GFR est AA: >60 mL/min/{1.73_m2} (ref >60)
GFR est non-AA: 57 mL/min/{1.73_m2} — ABNORMAL LOW (ref >60)
Globulin: 3.8 g/dL — ABNORMAL HIGH (ref 2.3–3.5)
Glucose: 134 mg/dL — ABNORMAL HIGH (ref 65–100)
Potassium: 3.7 mmol/L (ref 3.5–5.1)
Protein, total: 7.2 g/dL (ref 6.3–8.2)
Sodium: 137 mmol/L (ref 136–145)

## 2015-01-29 LAB — CBC WITH AUTOMATED DIFF
ABS. BASOPHILS: 0.1 10*3/uL (ref 0.0–0.2)
ABS. EOSINOPHILS: 0.5 10*3/uL (ref 0.0–0.8)
ABS. IMM. GRANS.: 0.1 10*3/uL (ref 0.0–0.5)
ABS. LYMPHOCYTES: 2.8 10*3/uL (ref 0.5–4.6)
ABS. MONOCYTES: 0.9 10*3/uL (ref 0.1–1.3)
ABS. NEUTROPHILS: 6.5 10*3/uL (ref 1.7–8.2)
BASOPHILS: 1 % (ref 0.0–2.0)
EOSINOPHILS: 5 % (ref 0.5–7.8)
HCT: 41.2 % (ref 41.1–50.3)
HGB: 14.5 g/dL (ref 13.6–17.2)
IMMATURE GRANULOCYTES: 1.1 % (ref 0.0–5.0)
LYMPHOCYTES: 26 % (ref 13–44)
MCH: 32.8 PG (ref 26.1–32.9)
MCHC: 35.2 g/dL — ABNORMAL HIGH (ref 31.4–35.0)
MCV: 93.2 FL (ref 79.6–97.8)
MONOCYTES: 8 % (ref 4.0–12.0)
MPV: 9.9 FL — ABNORMAL LOW (ref 10.8–14.1)
NEUTROPHILS: 60 % (ref 43–78)
PLATELET COMMENTS: ADEQUATE
PLATELET: 272 10*3/uL (ref 150–450)
RBC: 4.42 M/uL (ref 4.23–5.67)
RDW: 13.2 % (ref 11.9–14.6)
WBC: 10.8 10*3/uL (ref 4.3–11.1)

## 2015-01-29 LAB — EKG, 12 LEAD, INITIAL
Atrial Rate: 94 {beats}/min
Calculated P Axis: 60 degrees
Calculated R Axis: 62 degrees
Calculated T Axis: 73 degrees
Diagnosis: NORMAL
P-R Interval: 152 ms
Q-T Interval: 318 ms
QRS Duration: 86 ms
QTC Calculation (Bezet): 397 ms
Ventricular Rate: 94 {beats}/min

## 2015-01-29 LAB — MAGNESIUM: Magnesium: 2.4 mg/dL (ref 1.8–2.4)

## 2015-01-29 LAB — LACTIC ACID - RESPIRATORY: Lactic acid: 2.1 MMOL/L — ABNORMAL HIGH (ref 0.4–2.0)

## 2015-01-29 MED ORDER — ALBUTEROL SULFATE 0.083 % (0.83 MG/ML) SOLN FOR INHALATION
2.5 mg /3 mL (0.083 %) | Freq: Once | RESPIRATORY_TRACT | Status: AC
Start: 2015-01-29 — End: 2015-01-28
  Administered 2015-01-29: 02:00:00 via RESPIRATORY_TRACT

## 2015-01-29 MED ORDER — TIOTROPIUM BROMIDE 2.5 MCG/ACTUATION MIST FOR INHALATION
2.5 mcg/actuation | Freq: Every day | RESPIRATORY_TRACT | Status: DC
Start: 2015-01-29 — End: 2015-01-28

## 2015-01-29 MED ORDER — PREDNISONE 10 MG TABLETS IN A DOSE PACK
10 mg | ORAL_TABLET | ORAL | Status: DC
Start: 2015-01-29 — End: 2015-01-28

## 2015-01-29 MED ORDER — ALBUTEROL SULFATE HFA 90 MCG/ACTUATION AEROSOL INHALER
90 mcg/actuation | RESPIRATORY_TRACT | Status: DC | PRN
Start: 2015-01-29 — End: 2015-02-01

## 2015-01-29 MED ORDER — METHYLPREDNISOLONE (PF) 125 MG/2 ML IJ SOLR
125 mg/2 mL | Freq: Once | INTRAMUSCULAR | Status: AC
Start: 2015-01-29 — End: 2015-01-28
  Administered 2015-01-29: 03:00:00 via INTRAVENOUS

## 2015-01-29 MED ORDER — TIOTROPIUM BROMIDE 2.5 MCG/ACTUATION MIST FOR INHALATION
2.5 mcg/actuation | Freq: Every day | RESPIRATORY_TRACT | Status: DC
Start: 2015-01-29 — End: 2015-03-23

## 2015-01-29 MED ORDER — PREDNISONE 10 MG TABLETS IN A DOSE PACK
10 mg | ORAL_TABLET | ORAL | Status: DC
Start: 2015-01-29 — End: 2015-02-01

## 2015-01-29 MED FILL — SOLU-MEDROL (PF) 125 MG/2 ML SOLUTION FOR INJECTION: 125 mg/2 mL | INTRAMUSCULAR | Qty: 2

## 2015-01-29 MED FILL — ALBUTEROL SULFATE 0.083 % (0.83 MG/ML) SOLN FOR INHALATION: 2.5 mg /3 mL (0.083 %) | RESPIRATORY_TRACT | Qty: 4

## 2015-02-01 ENCOUNTER — Inpatient Hospital Stay
Admit: 2015-02-01 | Discharge: 2015-02-01 | Disposition: A | Discharge status: Home / Self Care | Payer: MEDICARE | Attending: Emergency Medicine

## 2015-02-01 ENCOUNTER — Emergency Department: Admit: 2015-02-01 | Payer: MEDICARE | Primary: Family Medicine

## 2015-02-01 DIAGNOSIS — J441 Chronic obstructive pulmonary disease with (acute) exacerbation: Secondary | ICD-10-CM

## 2015-02-01 LAB — CBC WITH AUTOMATED DIFF
ABS. BASOPHILS: 0.1 10*3/uL (ref 0.0–0.2)
ABS. EOSINOPHILS: 0.2 10*3/uL (ref 0.0–0.8)
ABS. IMM. GRANS.: 0.4 10*3/uL (ref 0.0–0.5)
ABS. LYMPHOCYTES: 3.5 10*3/uL (ref 0.5–4.6)
ABS. MONOCYTES: 1.1 10*3/uL (ref 0.1–1.3)
ABS. NEUTROPHILS: 11.5 10*3/uL — ABNORMAL HIGH (ref 1.7–8.2)
BASOPHILS: 0 % (ref 0.0–2.0)
EOSINOPHILS: 1 % (ref 0.5–7.8)
HCT: 42 % (ref 41.1–50.3)
HGB: 14.8 g/dL (ref 13.6–17.2)
IMMATURE GRANULOCYTES: 2.5 % (ref 0.0–5.0)
LYMPHOCYTES: 21 % (ref 13–44)
MCH: 32.7 PG (ref 26.1–32.9)
MCHC: 35.2 g/dL — ABNORMAL HIGH (ref 31.4–35.0)
MCV: 92.7 FL (ref 79.6–97.8)
MONOCYTES: 6 % (ref 4.0–12.0)
MPV: 9.7 FL — ABNORMAL LOW (ref 10.8–14.1)
NEUTROPHILS: 70 % (ref 43–78)
PLATELET: 288 10*3/uL (ref 150–450)
RBC: 4.53 M/uL (ref 4.23–5.67)
RDW: 13.2 % (ref 11.9–14.6)
WBC: 16.7 10*3/uL — ABNORMAL HIGH (ref 4.3–11.1)

## 2015-02-01 LAB — METABOLIC PANEL, COMPREHENSIVE
A-G Ratio: 0.8 — ABNORMAL LOW (ref 1.2–3.5)
ALT (SGPT): 53 U/L (ref 12–65)
AST (SGOT): 19 U/L (ref 15–37)
Albumin: 3.4 g/dL (ref 3.2–4.6)
Alk. phosphatase: 119 U/L (ref 50–136)
Anion gap: 11 mmol/L (ref 7–16)
BUN: 31 MG/DL — ABNORMAL HIGH (ref 8–23)
Bilirubin, total: 0.1 MG/DL — ABNORMAL LOW (ref 0.2–1.1)
CO2: 19 mmol/L — ABNORMAL LOW (ref 21–32)
Calcium: 9.1 MG/DL (ref 8.3–10.4)
Chloride: 105 mmol/L (ref 98–107)
Creatinine: 1.41 MG/DL (ref 0.8–1.5)
GFR est AA: >60 mL/min/{1.73_m2} (ref >60)
GFR est non-AA: 54 mL/min/{1.73_m2} — ABNORMAL LOW (ref >60)
Globulin: 4.1 g/dL — ABNORMAL HIGH (ref 2.3–3.5)
Glucose: 192 mg/dL — ABNORMAL HIGH (ref 65–100)
Potassium: 3.7 mmol/L (ref 3.5–5.1)
Protein, total: 7.5 g/dL (ref 6.3–8.2)
Sodium: 135 mmol/L — ABNORMAL LOW (ref 136–145)

## 2015-02-01 LAB — EKG, 12 LEAD, INITIAL
Atrial Rate: 87 {beats}/min
Calculated P Axis: 53 degrees
Calculated R Axis: 43 degrees
Calculated T Axis: 61 degrees
P-R Interval: 154 ms
Q-T Interval: 348 ms
QRS Duration: 86 ms
QTC Calculation (Bezet): 418 ms
Ventricular Rate: 87 {beats}/min

## 2015-02-01 LAB — LITHIUM: Lithium level: 0.47 MMOL/L — ABNORMAL LOW (ref 0.60–1.20)

## 2015-02-01 LAB — POC TROPONIN: Troponin-I (POC): 0 ng/ml (ref 0.0–0.08)

## 2015-02-01 MED ORDER — ALBUTEROL SULFATE HFA 90 MCG/ACTUATION AEROSOL INHALER
90 mcg/actuation | RESPIRATORY_TRACT | Status: DC | PRN
Start: 2015-02-01 — End: 2015-03-23

## 2015-02-01 MED ORDER — LITHIUM CARBONATE 300 MG CAP
300 mg | ORAL_CAPSULE | Freq: Two times a day (BID) | ORAL | Status: DC
Start: 2015-02-01 — End: 2015-05-02

## 2015-02-01 MED ORDER — METHYLPREDNISOLONE (PF) 125 MG/2 ML IJ SOLR
125 mg/2 mL | Freq: Once | INTRAMUSCULAR | Status: AC
Start: 2015-02-01 — End: 2015-02-01
  Administered 2015-02-01: 14:00:00 via INTRAVENOUS

## 2015-02-01 MED ORDER — PREDNISONE 20 MG TAB
20 mg | ORAL_TABLET | Freq: Every day | ORAL | Status: DC
Start: 2015-02-01 — End: 2015-02-07

## 2015-02-01 MED ORDER — IPRATROPIUM-ALBUTEROL 2.5 MG-0.5 MG/3 ML NEB SOLUTION
2.5 mg-0.5 mg/3 ml | RESPIRATORY_TRACT | Status: AC
Start: 2015-02-01 — End: 2015-02-01
  Administered 2015-02-01: 13:00:00 via RESPIRATORY_TRACT

## 2015-02-01 MED FILL — SOLU-MEDROL (PF) 125 MG/2 ML SOLUTION FOR INJECTION: 125 mg/2 mL | INTRAMUSCULAR | Qty: 2

## 2015-02-01 MED FILL — IPRATROPIUM-ALBUTEROL 2.5 MG-0.5 MG/3 ML NEB SOLUTION: 2.5 mg-0.5 mg/3 ml | RESPIRATORY_TRACT | Qty: 3

## 2015-02-01 NOTE — Progress Notes (Signed)
Patient's sister called case management and shared that she needed some help with getting a appointment to mental health for patient. She also stated that the patient is on lithium and she was concerned that he was going to run out. SW discussed helping with scheduling an appointment with Mental Health. SW discussed the patient's sister's concerns with nurse and MD. ??  ??    ?? ??  ??

## 2015-02-01 NOTE — ED Provider Notes (Signed)
HPI Comments: Patient with a history of recollection of any DVT.  Currently on xarelto.  Has a history of COPD also.  Seen here 6/12 with COPD exacerbation and relief after nebulizers.  Comes back in with worsening shortness of breath for the past couple days.  Still smokes.  Mild cough no chest pain.    Patient is a 64 y.o. male presenting with shortness of breath. The history is provided by the patient. No language interpreter was used.   Shortness of Breath  This is a recurrent problem. The problem occurs continuously.The current episode started more than 2 days ago. The problem has been gradually worsening. Associated symptoms include cough, wheezing and leg swelling (right mild). Pertinent negatives include no fever, no headaches, no rhinorrhea, no sore throat, no neck pain, no sputum production, no orthopnea, no chest pain, no vomiting, no abdominal pain, no rash and no leg pain. He has tried nothing for the symptoms. Associated medical issues include COPD and DVT.        Past Medical History:   Diagnosis Date   ??? Hypertension    ??? Thromboembolus ([place])    ??? COPD      emphysema   ??? Asthma    ??? Cancer ([place])      skin   ??? Sleep disorder    ??? Psychiatric disorder      bipolar   ??? Diabetes ([place])      NIDDM   ??? Other ill-defined conditions(799.89)      DVT ble/ lue       Past Surgical History:   Procedure Laterality Date   ??? Pr cardiac surg procedure unlist       heart cath 2007         History reviewed. No pertinent family history.    History     Social History   ??? Marital Status: DIVORCED     Spouse Name: N/A   ??? Number of Children: N/A   ??? Years of Education: N/A     Occupational History   ??? Not on file.     Social History Main Topics   ??? Smoking status: Current Every Day Smoker -- 2.00 packs/day   ??? Smokeless tobacco: Not on file   ??? Alcohol Use: Yes   ??? Drug Use: Yes     Special: Marijuana   ??? Sexual Activity: Not on file     Other Topics Concern   ??? Not on file     Social History Narrative          ALLERGIES: Latex, natural rubber and Haldol    Review of Systems   Constitutional: Negative for fever and chills.   HENT: Negative for rhinorrhea and sore throat.    Eyes: Negative for pain and redness.   Respiratory: Positive for cough, shortness of breath and wheezing. Negative for sputum production and chest tightness.    Cardiovascular: Positive for leg swelling (right mild). Negative for chest pain and orthopnea.   Gastrointestinal: Negative for nausea, vomiting, abdominal pain and diarrhea.   Genitourinary: Negative for dysuria and hematuria.   Musculoskeletal: Negative for back pain, gait problem, neck pain and neck stiffness.   Skin: Negative for color change and rash.   Neurological: Negative for weakness, numbness and headaches.       Filed Vitals:    02/01/15 0842   BP: 157/97   Pulse: 87   Temp: 98.1 ??F (36.7 ??C)   Resp: 20   Height: 6' (1.829 m)  Weight: 96.616 kg (213 lb)   SpO2: 97%            Physical Exam   Constitutional: He is oriented to person, place, and time. He appears well-developed and well-nourished. No distress.   HENT:   Head: Normocephalic and atraumatic.   Neck: Normal range of motion. Neck supple.   Cardiovascular: Normal rate and regular rhythm.    No murmur heard.  Pulmonary/Chest: Effort normal. No respiratory distress. He has wheezes.   Abdominal: Soft. Bowel sounds are normal. There is no tenderness.   Musculoskeletal: Normal range of motion. He exhibits edema. He exhibits no tenderness.   Neurological: He is alert and oriented to person, place, and time.   Skin: Skin is warm and dry.   Nursing note and vitals reviewed.       MDM  Number of Diagnoses or Management Options  Diagnosis management comments: With no chest pain or tachycardia. Will treat this as COPD exacerbation.        Amount and/or Complexity of Data Reviewed  Clinical lab tests: ordered and reviewed  Tests in the radiology section of CPT??: ordered and reviewed   Tests in the medicine section of CPT??: ordered and reviewed    Risk of Complications, Morbidity, and/or Mortality  Presenting problems: moderate  Diagnostic procedures: moderate  Management options: moderate    Patient Progress  Patient progress: stable      Procedures      EKG: normal sinus rhythm, nonspecific ST and T waves changes. Rate 82.      XR CHEST PORT (Final result) Result time: 02/01/15 10:47:35   ?? Final result by Coralie Keens, MD (02/01/15 10:47:35)   ?? Impression:   ?? Impression: ??Negative for acute findings in the chest.     ?? Narrative:   ?? Portable Chest ??X-ray ??02/01/2015 at 1008 hours    Indication: Shortness of breath    Comparison: ??Chest x-ray 01/28/2015    Findings: ??Portable AP view demonstrates clear lungs and pleural spaces.  Negative for pneumothorax. Heart size normal.     ??     Results Include:    Recent Results (from the past 24 hour(s))   CBC WITH AUTOMATED DIFF    Collection Time: 02/01/15  8:47 AM   Result Value Ref Range    WBC 16.7 (H) 4.3 - 11.1 K/uL    RBC 4.53 4.23 - 5.67 M/uL    HGB 14.8 13.6 - 17.2 g/dL    HCT 42.0 41.1 - 50.3 %    MCV 92.7 79.6 - 97.8 FL    MCH 32.7 26.1 - 32.9 PG    MCHC 35.2 (H) 31.4 - 35.0 g/dL    RDW 13.2 11.9 - 14.6 %    PLATELET 288 150 - 450 K/uL    MPV 9.7 (L) 10.8 - 14.1 FL    DF AUTOMATED      NEUTROPHILS 70 43 - 78 %    LYMPHOCYTES 21 13 - 44 %    MONOCYTES 6 4.0 - 12.0 %    EOSINOPHILS 1 0.5 - 7.8 %    BASOPHILS 0 0.0 - 2.0 %    IMMATURE GRANULOCYTES 2.5 0.0 - 5.0 %    ABS. NEUTROPHILS 11.5 (H) 1.7 - 8.2 K/UL    ABS. LYMPHOCYTES 3.5 0.5 - 4.6 K/UL    ABS. MONOCYTES 1.1 0.1 - 1.3 K/UL    ABS. EOSINOPHILS 0.2 0.0 - 0.8 K/UL    ABS. BASOPHILS 0.1 0.0 - 0.2  K/UL    ABS. IMM. GRANS. 0.4 0.0 - 0.5 K/UL   METABOLIC PANEL, COMPREHENSIVE    Collection Time: 02/01/15  8:47 AM   Result Value Ref Range    Sodium 135 (L) 136 - 145 mmol/L    Potassium 3.7 3.5 - 5.1 mmol/L    Chloride 105 98 - 107 mmol/L    CO2 19 (L) 21 - 32 mmol/L     Anion gap 11 7 - 16 mmol/L    Glucose 192 (H) 65 - 100 mg/dL    BUN 31 (H) 8 - 23 MG/DL    Creatinine 1.41 0.8 - 1.5 MG/DL    GFR est AA >60 >60 ml/min/1.14m    GFR est non-AA 54 (L) >60 ml/min/1.768m   Calcium 9.1 8.3 - 10.4 MG/DL    Bilirubin, total 0.1 (L) 0.2 - 1.1 MG/DL    ALT 53 12 - 65 U/L    AST 19 15 - 37 U/L    Alk. phosphatase 119 50 - 136 U/L    Protein, total 7.5 6.3 - 8.2 g/dL    Albumin 3.4 3.2 - 4.6 g/dL    Globulin 4.1 (H) 2.3 - 3.5 g/dL    A-G Ratio 0.8 (L) 1.2 - 3.5     POC TROPONIN-I    Collection Time: 02/01/15  8:52 AM   Result Value Ref Range    Troponin-I (POC) 0 0.0 - 0.08 ng/ml             LITHIUM (Final result)????????Abnormal Component (Lab Inquiry)   ?? ?? Collection Time Result Time LI   ?? 02/01/15 09:20:00 02/01/15 11:42:57 ?? 0.47 (L)   ??   (Comment)    ?? Potentially toxic: ?? >1.40 mmol/L   Warning Range: ?? ??1.20-1.50 mmol/L drawn   12 hr after dose administration.   NOTE: THE REFERENCE RANGE REFLECTS THE   CURRENT TEST METHOD THERAPEUTIC RANGE.      ??      ??      ??

## 2015-02-01 NOTE — Progress Notes (Signed)
Patient has an appointment for 8:30 a.m. At [facility] mental health

## 2015-02-01 NOTE — ED Notes (Signed)
I have reviewed medications, follow up provider options, and discharge instructions with the patient. The patient verbalized understanding. Copy of discharge information given to patient upon discharge. Prescription(s) given to patient. Patient discharged in no distress.

## 2015-02-01 NOTE — ED Notes (Signed)
Per ems pt has been seen for similar problem a couple of days ago. Per ems pt with increased shortness of breath today. Per ems pt's bgl 207

## 2015-02-04 ENCOUNTER — Emergency Department: Admit: 2015-02-05 | Payer: MEDICARE | Primary: Family Medicine

## 2015-02-04 DIAGNOSIS — J441 Chronic obstructive pulmonary disease with (acute) exacerbation: Principal | ICD-10-CM
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
## 2015-02-04 NOTE — ED Notes (Signed)
Patient complains of chest pain that began about 30 minutes pta. EMS gave 2 NTG and 324 ASA. States pain is on the right side of his chest. Denies vomiting but states nausea and shob.

## 2015-02-04 NOTE — ED Notes (Signed)
Pt in CT, will obtained POC trop when he returns.

## 2015-02-04 NOTE — ED Notes (Signed)
Pt in bed with one bedrail up, back from xray.  No distress noted.

## 2015-02-05 ENCOUNTER — Inpatient Hospital Stay
Admit: 2015-02-05 | Discharge: 2015-02-07 | Disposition: A | Discharge status: Home / Self Care | Payer: MEDICARE | Attending: Internal Medicine | Admitting: Internal Medicine

## 2015-02-05 LAB — D-DIMER, QUANTITATIVE: D-Dimer, Quant: 0.22 ug/ml(FEU) (ref <0.55)

## 2015-02-05 LAB — METABOLIC PANEL, COMPREHENSIVE
A-G Ratio: 1 — ABNORMAL LOW (ref 1.2–3.5)
ALT (SGPT): 44 U/L (ref 12–65)
AST (SGOT): 15 U/L (ref 15–37)
Albumin: 3.6 g/dL (ref 3.2–4.6)
Alk. phosphatase: 99 U/L (ref 50–136)
Anion gap: 6 mmol/L — ABNORMAL LOW (ref 7–16)
BUN: 34 MG/DL — ABNORMAL HIGH (ref 8–23)
Bilirubin, total: 0.3 MG/DL (ref 0.2–1.1)
CO2: 26 mmol/L (ref 21–32)
Calcium: 9.6 MG/DL (ref 8.3–10.4)
Chloride: 103 mmol/L (ref 98–107)
Creatinine: 1.4 MG/DL (ref 0.8–1.5)
GFR est AA: >60 mL/min/{1.73_m2} (ref >60)
GFR est non-AA: 54 mL/min/{1.73_m2} — ABNORMAL LOW (ref >60)
Globulin: 3.7 g/dL — ABNORMAL HIGH (ref 2.3–3.5)
Glucose: 174 mg/dL — ABNORMAL HIGH (ref 65–100)
Potassium: 3.8 mmol/L (ref 3.5–5.1)
Protein, total: 7.3 g/dL (ref 6.3–8.2)
Sodium: 135 mmol/L — ABNORMAL LOW (ref 136–145)

## 2015-02-05 LAB — CBC WITH AUTOMATED DIFF
ABS. BASOPHILS: 0.1 10*3/uL (ref 0.0–0.2)
ABS. EOSINOPHILS: 0.1 10*3/uL (ref 0.0–0.8)
ABS. IMM. GRANS.: 1.1 10*3/uL — ABNORMAL HIGH (ref 0.0–0.5)
ABS. LYMPHOCYTES: 3.7 10*3/uL (ref 0.5–4.6)
ABS. MONOCYTES: 1.3 10*3/uL (ref 0.1–1.3)
ABS. NEUTROPHILS: 15.4 10*3/uL — ABNORMAL HIGH (ref 1.7–8.2)
BASOPHILS: 0 % (ref 0.0–2.0)
EOSINOPHILS: 1 % (ref 0.5–7.8)
HCT: 42.9 % (ref 41.1–50.3)
HGB: 15.3 g/dL (ref 13.6–17.2)
IMMATURE GRANULOCYTES: 5 % (ref 0.0–5.0)
LYMPHOCYTES: 17 % (ref 13–44)
MCH: 33 PG — ABNORMAL HIGH (ref 26.1–32.9)
MCHC: 35.7 g/dL — ABNORMAL HIGH (ref 31.4–35.0)
MCV: 92.5 FL (ref 79.6–97.8)
MONOCYTES: 6 % (ref 4.0–12.0)
MPV: 9.9 FL — ABNORMAL LOW (ref 10.8–14.1)
NEUTROPHILS: 71 % (ref 43–78)
PLATELET: 304 10*3/uL (ref 150–450)
RBC: 4.64 M/uL (ref 4.23–5.67)
RDW: 13.2 % (ref 11.9–14.6)
WBC: 21.6 10*3/uL — ABNORMAL HIGH (ref 4.3–11.1)

## 2015-02-05 LAB — GLUCOSE, POC
Glucose (POC): 136 mg/dL — ABNORMAL HIGH (ref 65–100)
Glucose (POC): 239 mg/dL — ABNORMAL HIGH (ref 65–100)
Glucose (POC): 196 mg/dL — ABNORMAL HIGH (ref 65–100)

## 2015-02-05 LAB — EKG, 12 LEAD, INITIAL
Atrial Rate: 97 {beats}/min
Calculated P Axis: 75 degrees
Calculated R Axis: 63 degrees
Calculated T Axis: 70 degrees
Diagnosis: NORMAL
P-R Interval: 140 ms
Q-T Interval: 344 ms
QRS Duration: 74 ms
QTC Calculation (Bezet): 436 ms
Ventricular Rate: 97 {beats}/min

## 2015-02-05 LAB — POC TROPONIN: Troponin-I (POC): 0 ng/ml (ref 0.0–0.08)

## 2015-02-05 LAB — TROPONIN I: Troponin-I, Qt.: <0.02 NG/ML — ABNORMAL LOW (ref 0.02–0.05)

## 2015-02-05 LAB — PROTHROMBIN TIME + INR
INR: 1 (ref 0.9–1.2)
Prothrombin time: 10.7 s (ref 9.6–12.0)

## 2015-02-05 LAB — D DIMER: D DIMER: 0.22 ug/ml(FEU) (ref <0.55)

## 2015-02-05 MED ORDER — INSULIN LISPRO 100 UNIT/ML INJECTION
100 unit/mL | Freq: Four times a day (QID) | SUBCUTANEOUS | Status: DC
Start: 2015-02-05 — End: 2015-02-07
  Administered 2015-02-05 – 2015-02-07 (×9): via SUBCUTANEOUS

## 2015-02-05 MED ORDER — ALBUTEROL SULFATE 0.083 % (0.83 MG/ML) SOLN FOR INHALATION
2.5 mg /3 mL (0.083 %) | Freq: Four times a day (QID) | RESPIRATORY_TRACT | Status: DC
Start: 2015-02-05 — End: 2015-02-06
  Administered 2015-02-05 – 2015-02-06 (×4): via RESPIRATORY_TRACT

## 2015-02-05 MED ORDER — SALINE PERIPHERAL FLUSH PRN
Freq: Once | INTRAMUSCULAR | Status: AC
Start: 2015-02-05 — End: 2015-02-04
  Administered 2015-02-05: 03:00:00

## 2015-02-05 MED ORDER — SODIUM CHLORIDE 0.9% BOLUS IV
0.9 % | Freq: Once | INTRAVENOUS | Status: AC
Start: 2015-02-05 — End: 2015-02-05
  Administered 2015-02-05: 03:00:00 via INTRAVENOUS

## 2015-02-05 MED ORDER — SODIUM CHLORIDE 0.9 % IJ SYRG
Freq: Three times a day (TID) | INTRAMUSCULAR | Status: DC
Start: 2015-02-05 — End: 2015-02-07
  Administered 2015-02-05 – 2015-02-07 (×7): via INTRAVENOUS

## 2015-02-05 MED ORDER — LITHIUM CARBONATE 300 MG CAP
300 mg | Freq: Two times a day (BID) | ORAL | Status: DC
Start: 2015-02-05 — End: 2015-02-07
  Administered 2015-02-05 – 2015-02-07 (×5): via ORAL

## 2015-02-05 MED ORDER — IOPAMIDOL 76 % IV SOLN
370 mg iodine /mL (76 %) | Freq: Once | INTRAVENOUS | Status: AC
Start: 2015-02-05 — End: 2015-02-04
  Administered 2015-02-05: 03:00:00 via INTRAVENOUS

## 2015-02-05 MED ORDER — SODIUM CHLORIDE 0.9 % IJ SYRG
INTRAMUSCULAR | Status: DC | PRN
Start: 2015-02-05 — End: 2015-02-07

## 2015-02-05 MED ORDER — PREDNISONE 20 MG TAB
20 mg | Freq: Every day | ORAL | Status: DC
Start: 2015-02-05 — End: 2015-02-06
  Administered 2015-02-05 – 2015-02-06 (×2): via ORAL

## 2015-02-05 MED ORDER — IPRATROPIUM-ALBUTEROL 2.5 MG-0.5 MG/3 ML NEB SOLUTION
2.5 mg-0.5 mg/3 ml | RESPIRATORY_TRACT | Status: AC
Start: 2015-02-05 — End: 2015-02-04
  Administered 2015-02-05: 03:00:00 via RESPIRATORY_TRACT

## 2015-02-05 MED ORDER — GABAPENTIN 400 MG CAP
400 mg | Freq: Three times a day (TID) | ORAL | Status: DC
Start: 2015-02-05 — End: 2015-02-07
  Administered 2015-02-05 – 2015-02-07 (×8): via ORAL

## 2015-02-05 MED ORDER — ENOXAPARIN 120 MG/0.8 ML SUB-Q SYRINGE
120 mg/0.8 mL | Freq: Two times a day (BID) | SUBCUTANEOUS | Status: DC
Start: 2015-02-05 — End: 2015-02-06
  Administered 2015-02-05 – 2015-02-06 (×3): via SUBCUTANEOUS

## 2015-02-05 MED FILL — GABAPENTIN 400 MG CAP: 400 mg | ORAL | Qty: 1

## 2015-02-05 MED FILL — LOVENOX 120 MG/0.8 ML SUBCUTANEOUS SYRINGE: 120 mg/0.8 mL | SUBCUTANEOUS | Qty: 0.8

## 2015-02-05 MED FILL — LITHIUM CARBONATE 300 MG CAP: 300 mg | ORAL | Qty: 1

## 2015-02-05 MED FILL — PREDNISONE 20 MG TAB: 20 mg | ORAL | Qty: 2

## 2015-02-05 MED FILL — ALBUTEROL SULFATE 0.083 % (0.83 MG/ML) SOLN FOR INHALATION: 2.5 mg /3 mL (0.083 %) | RESPIRATORY_TRACT | Qty: 1

## 2015-02-05 MED FILL — IPRATROPIUM-ALBUTEROL 2.5 MG-0.5 MG/3 ML NEB SOLUTION: 2.5 mg-0.5 mg/3 ml | RESPIRATORY_TRACT | Qty: 3

## 2015-02-05 NOTE — ED Notes (Signed)
Pt resting in bed, both bed rails up.  No distress noted.

## 2015-02-05 NOTE — Progress Notes (Signed)
Dual skin assessment completed with Jessica Welborn. No open or red areas noted

## 2015-02-05 NOTE — Progress Notes (Signed)
Problem: Breathing Pattern - Ineffective  Goal: *Absence of hypoxia  Outcome: Progressing Towards Goal  Pt on RA, SAT 97%. BBS diminished.
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
## 2015-02-06 ENCOUNTER — Inpatient Hospital Stay: Admit: 2015-02-06 | Payer: MEDICARE | Primary: Family Medicine

## 2015-02-06 LAB — GLUCOSE, POC
Glucose (POC): 163 mg/dL — ABNORMAL HIGH (ref 65–100)
Glucose (POC): 142 mg/dL — ABNORMAL HIGH (ref 65–100)
Glucose (POC): 183 mg/dL — ABNORMAL HIGH (ref 65–100)
Glucose (POC): 321 mg/dL — ABNORMAL HIGH (ref 65–100)

## 2015-02-06 MED ORDER — RIVAROXABAN 20 MG TAB
20 mg | Freq: Every day | ORAL | Status: DC
Start: 2015-02-06 — End: 2015-02-07
  Administered 2015-02-06: 20:00:00 via ORAL

## 2015-02-06 MED ORDER — METHYLPREDNISOLONE (PF) 40 MG/ML IJ SOLR
40 mg/mL | Freq: Four times a day (QID) | INTRAMUSCULAR | Status: DC
Start: 2015-02-06 — End: 2015-02-07
  Administered 2015-02-06 – 2015-02-07 (×5): via INTRAVENOUS

## 2015-02-06 MED ORDER — ALBUTEROL SULFATE 0.083 % (0.83 MG/ML) SOLN FOR INHALATION
2.5 mg /3 mL (0.083 %) | RESPIRATORY_TRACT | Status: DC
Start: 2015-02-06 — End: 2015-02-07
  Administered 2015-02-06 – 2015-02-07 (×6): via RESPIRATORY_TRACT

## 2015-02-06 MED FILL — ALBUTEROL SULFATE 0.083 % (0.83 MG/ML) SOLN FOR INHALATION: 2.5 mg /3 mL (0.083 %) | RESPIRATORY_TRACT | Qty: 1

## 2015-02-06 MED FILL — GABAPENTIN 400 MG CAP: 400 mg | ORAL | Qty: 1

## 2015-02-06 MED FILL — LITHIUM CARBONATE 300 MG CAP: 300 mg | ORAL | Qty: 1

## 2015-02-06 MED FILL — XARELTO 20 MG TABLET: 20 mg | ORAL | Qty: 1

## 2015-02-06 MED FILL — LOVENOX 120 MG/0.8 ML SUBCUTANEOUS SYRINGE: 120 mg/0.8 mL | SUBCUTANEOUS | Qty: 0.8

## 2015-02-06 MED FILL — PREDNISONE 20 MG TAB: 20 mg | ORAL | Qty: 2

## 2015-02-06 MED FILL — SOLU-MEDROL (PF) 40 MG/ML SOLUTION FOR INJECTION: 40 mg/mL | INTRAMUSCULAR | Qty: 2
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
## 2015-02-06 NOTE — Progress Notes (Signed)
END OF SHIFT NOTE:    Intake/Output      Voiding: YES  Catheter: NO  Drain:              Stool:  0 occurrences.         Emesis:  0 occurrences.          VITAL SIGNS  Patient Vitals for the past 12 hrs:   Temp Pulse Resp BP SpO2   02/05/15 2358 97.8 ??F (36.6 ??C) 71 16 (!) 142/92 mmHg 98 %   02/05/15 2148 - - - - 95 %   02/05/15 1926 98.3 ??F (36.8 ??C) 74 18 138/76 mmHg 94 %       Pain Assessment  Pain 1  Pain Scale 1: Numeric (0 - 10) (02/06/15 0250)  Pain Intensity 1: 0 (02/06/15 0250)  Patient Stated Pain Goal: 0 (02/06/15 0250)  Pain Reassessment 1: Yes (02/05/15 1926)  Pain Onset 1: today (02/04/15 2024)  Pain Location 1: Chest (02/05/15 0354)  Pain Orientation 1: Anterior;Mid (02/05/15 0354)  Pain Description 1: Aching (02/05/15 0354)  Pain Intervention(s) 1: Declines (02/05/15 0354)    Ambulating  YES    Additional Information: No complaints throughout the evening.  Rested comfortably.     Shift report given to oncoming nurse at the bedside.    Derrek Gu, RN

## 2015-02-06 NOTE — Progress Notes (Signed)
Problem: Breathing Pattern - Ineffective  Goal: *Absence of hypoxia  Outcome: Progressing Towards Goal  Patient is 95% on room air, has scattered wheezing crackles in bases and has a strong congestive cough.

## 2015-02-07 LAB — GLUCOSE, POC
Glucose (POC): 281 mg/dL — ABNORMAL HIGH (ref 65–100)
Glucose (POC): 278 mg/dL — ABNORMAL HIGH (ref 65–100)

## 2015-02-07 MED ORDER — PREDNISONE 20 MG TAB
20 mg | ORAL_TABLET | Freq: Every day | ORAL | Status: AC
Start: 2015-02-07 — End: 2015-02-12

## 2015-02-07 MED FILL — LITHIUM CARBONATE 300 MG CAP: 300 mg | ORAL | Qty: 1

## 2015-02-07 MED FILL — GABAPENTIN 400 MG CAP: 400 mg | ORAL | Qty: 1

## 2015-02-07 MED FILL — ALBUTEROL SULFATE 0.083 % (0.83 MG/ML) SOLN FOR INHALATION: 2.5 mg /3 mL (0.083 %) | RESPIRATORY_TRACT | Qty: 1

## 2015-02-07 MED FILL — SOLU-MEDROL (PF) 40 MG/ML SOLUTION FOR INJECTION: 40 mg/mL | INTRAMUSCULAR | Qty: 2

## 2015-02-07 MED FILL — INSULIN LISPRO 100 UNIT/ML INJECTION: 100 unit/mL | SUBCUTANEOUS | Qty: 1
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
## 2015-02-07 NOTE — Progress Notes (Signed)
END OF SHIFT NOTE:    Intake/Output      Voiding: YES  Catheter: NO  Drain:              Stool:  0 occurrences.         Emesis:  0 occurrences.          VITAL SIGNS  Patient Vitals for the past 12 hrs:   Temp Pulse Resp BP SpO2   02/07/15 0356 98 ??F (36.7 ??C) 87 18 140/75 mmHg 93 %   02/07/15 0309 - - - - 93 %   02/07/15 0032 - - - - 94 %   02/06/15 2336 97.4 ??F (36.3 ??C) 87 18 155/74 mmHg 94 %   02/06/15 2219 - - - - 94 %   02/06/15 1852 97.8 ??F (36.6 ??C) 84 18 140/74 mmHg 95 %       Pain Assessment  Pain 1  Pain Scale 1: Numeric (0 - 10) (02/07/15 0238)  Pain Intensity 1: 0 (02/07/15 0238)  Patient Stated Pain Goal: 0 (02/07/15 0238)  Pain Reassessment 1: Yes (02/05/15 1926)  Pain Onset 1: today (02/04/15 2024)  Pain Location 1: Chest (02/05/15 0354)  Pain Orientation 1: Anterior;Mid (02/05/15 0354)  Pain Description 1: Aching (02/05/15 0354)  Pain Intervention(s) 1: Declines (02/05/15 0354)    Ambulating  YES    Additional Information: Rested well throughout the night.      Shift report given to oncoming nurse at the bedside.    Derrek Gu, RN

## 2015-02-07 NOTE — Progress Notes (Signed)
Discharge instructions/meds reviewed with patient and he verbalized understanding.  Pt is discharged via wheelchair and yellow cab picking him up to transport him to his home

## 2015-02-07 NOTE — Progress Notes (Signed)
During bedside report the room smelled of cigarette smoke.  This RN educated the pt on the no smoking policy of the hospital and offered to get a nicotine patch ordered - patient denied smoking in the room or bathroom and declined a nicotine patch.

## 2015-02-11 ENCOUNTER — Inpatient Hospital Stay
Admit: 2015-02-11 | Discharge: 2015-02-12 | Disposition: A | Discharge status: Home / Self Care | Payer: MEDICARE | Attending: Emergency Medicine

## 2015-02-11 ENCOUNTER — Emergency Department: Admit: 2015-02-12 | Payer: MEDICARE | Primary: Family Medicine

## 2015-02-11 DIAGNOSIS — J441 Chronic obstructive pulmonary disease with (acute) exacerbation: Secondary | ICD-10-CM

## 2015-02-11 NOTE — ED Notes (Signed)
Triage interrupted due to patient abusive verbal behavior.

## 2015-02-11 NOTE — ED Provider Notes (Addendum)
HPI Comments: Patient [facility] department complaining of shortness of breath in the custody of the cranial [facility].  The exact circumstances for the rest are unknown but he states the shortness of breath he's having is unchanged from that of prior.  At The Patient about a Week Ago Diagnosed with Pulmonary Emboli with a History of DVT on Zarrella Toe.  He Was Admitted on the 19th Discharged on the 22nd Discontinued As Her Relative He Reported a History of Hypertension but Is on No Medication for It.  He's Had Chronic Nonproductive Cough He Denies Any Chest Pain and Review of Systems Otherwise Negative    Patient is a 64 y.o. male presenting with shortness of breath. The history is provided by the patient.   Shortness of Breath  This is a chronic problem. The problem occurs continuously.The current episode started more than 1 week ago. The problem has not changed since onset.Associated symptoms include cough. Pertinent negatives include no fever, no headaches, no coryza, no rhinorrhea, no sore throat, no swollen glands, no ear pain, no neck pain, no sputum production, no hemoptysis, no wheezing, no PND, no orthopnea, no chest pain, no syncope, no vomiting, no abdominal pain, no rash, no leg pain, no leg swelling and no claudication. The problem's precipitants include pollens. Treatments tried: xarelto. The treatment provided no relief. He has had prior hospitalizations. He has had prior ED visits. He has had no prior ICU admissions. Associated medical issues include COPD, PE and DVT.        Past Medical History:   Diagnosis Date   ??? Hypertension    ??? Thromboembolus (HCC)    ??? COPD      emphysema   ??? Asthma    ??? Cancer (HCC)      skin   ??? Sleep disorder    ??? Psychiatric disorder      bipolar   ??? Diabetes (HCC)      NIDDM   ??? Other ill-defined conditions(799.89)      DVT ble/ lue   ??? Obesity due to excess calories 02/05/2015       Past Surgical History:   Procedure Laterality Date    ??? Pr cardiac surg procedure unlist       heart cath 2007         History reviewed. No pertinent family history.    History     Social History   ??? Marital Status: DIVORCED     Spouse Name: N/A   ??? Number of Children: N/A   ??? Years of Education: N/A     Occupational History   ??? Not on file.     Social History Main Topics   ??? Smoking status: Current Every Day Smoker -- 1.00 packs/day for 55 years   ??? Smokeless tobacco: Never Used   ??? Alcohol Use: Yes   ??? Drug Use: Yes     Special: Marijuana   ??? Sexual Activity: Not Currently     Other Topics Concern   ??? Not on file     Social History Narrative         ALLERGIES: Latex, natural rubber; Other food; Grapefruit; and Haldol    Review of Systems   Constitutional: Negative for fever.   HENT: Negative for ear pain, rhinorrhea and sore throat.    Respiratory: Positive for cough and shortness of breath. Negative for hemoptysis, sputum production and wheezing.    Cardiovascular: Negative for chest pain, orthopnea, claudication, leg swelling, syncope  and PND.   Gastrointestinal: Negative for vomiting and abdominal pain.   Musculoskeletal: Negative for neck pain.   Skin: Negative for rash.   Neurological: Negative for headaches.   All other systems reviewed and are negative.      Filed Vitals:    02/11/15 1948 02/11/15 1951   BP: 196/107 196/107   Pulse: 106 112   Temp: 98 ??F (36.7 ??C)    Resp: 22    Height: 6' (1.829 m)    Weight: 104.327 kg (230 lb)    SpO2: 94% 90%            Physical Exam   Constitutional: He is oriented to person, place, and time. He appears well-developed and well-nourished. No distress.   HENT:   Head: Normocephalic and atraumatic.   Eyes: Conjunctivae and EOM are normal. Pupils are equal, round, and reactive to light.   Neck: Normal range of motion. Neck supple.   Cardiovascular: Normal rate, regular rhythm and normal heart sounds.    Pulmonary/Chest: Effort normal. No respiratory distress. He has no wheezes. He has rales. He exhibits no tenderness.    Abdominal: Soft. Bowel sounds are normal.   Musculoskeletal: Normal range of motion. He exhibits no edema or tenderness.   Neurological: He is alert and oriented to person, place, and time.   Skin: Skin is warm and dry.   Psychiatric: He has a normal mood and affect. His behavior is normal.   Nursing note and vitals reviewed.       MDM  Number of Diagnoses or Management Options     Amount and/or Complexity of Data Reviewed  Clinical lab tests: ordered and reviewed  Tests in the radiology section of CPT??: ordered and reviewed  Review and summarize past medical records: yes  Independent visualization of images, tracings, or specimens: yes (EKG at 2108 demonstrates sinus tachycardia, rate of 103 no acute ischemic changes noted no change from prior)    Risk of Complications, Morbidity, and/or Mortality  Presenting problems: high  Diagnostic procedures: high  Management options: moderate    Patient Progress  Patient progress: stable      Procedures

## 2015-02-11 NOTE — ED Notes (Signed)
Patient discharged to [facility]. Patient ambulatory off the unit. No distress noted at this time.
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
## 2015-02-11 NOTE — ED Notes (Signed)
Patient resting in bed with eyes closed. Respirations present. Vitals cycling. No distress noted at this time. Officer at bedside

## 2015-02-11 NOTE — ED Notes (Signed)
Arrived with [facility] and Microbiologist, [facility].

## 2015-02-12 LAB — CBC WITH AUTOMATED DIFF
ABS. BASOPHILS: 0 10*3/uL (ref 0.0–0.2)
ABS. EOSINOPHILS: 0 10*3/uL (ref 0.0–0.8)
ABS. IMM. GRANS.: 0.6 10*3/uL — ABNORMAL HIGH (ref 0.0–0.5)
ABS. LYMPHOCYTES: 1 10*3/uL (ref 0.5–4.6)
ABS. MONOCYTES: 0.5 10*3/uL (ref 0.1–1.3)
ABS. NEUTROPHILS: 15.1 10*3/uL — ABNORMAL HIGH (ref 1.7–8.2)
BASOPHILS: 0 % (ref 0.0–2.0)
EOSINOPHILS: 0 % — ABNORMAL LOW (ref 0.5–7.8)
HCT: 43.8 % (ref 41.1–50.3)
HGB: 15.3 g/dL (ref 13.6–17.2)
IMMATURE GRANULOCYTES: 3.6 % (ref 0.0–5.0)
LYMPHOCYTES: 6 % — ABNORMAL LOW (ref 13–44)
MCH: 32.7 PG (ref 26.1–32.9)
MCHC: 34.9 g/dL (ref 31.4–35.0)
MCV: 93.6 FL (ref 79.6–97.8)
MONOCYTES: 3 % — ABNORMAL LOW (ref 4.0–12.0)
MPV: 9.6 FL — ABNORMAL LOW (ref 10.8–14.1)
NEUTROPHILS: 87 % — ABNORMAL HIGH (ref 43–78)
PLATELET: 225 10*3/uL (ref 150–450)
RBC: 4.68 M/uL (ref 4.23–5.67)
RDW: 13.2 % (ref 11.9–14.6)
WBC: 17.2 10*3/uL — ABNORMAL HIGH (ref 4.3–11.1)

## 2015-02-12 LAB — METABOLIC PANEL, BASIC
Anion gap: 10 mmol/L (ref 7–16)
BUN: 23 MG/DL (ref 8–23)
CO2: 22 mmol/L (ref 21–32)
Calcium: 8.8 MG/DL (ref 8.3–10.4)
Chloride: 105 mmol/L (ref 98–107)
Creatinine: 1.79 MG/DL — ABNORMAL HIGH (ref 0.8–1.5)
GFR est AA: 49 mL/min/{1.73_m2} — ABNORMAL LOW (ref >60)
GFR est non-AA: 41 mL/min/{1.73_m2} — ABNORMAL LOW (ref >60)
Glucose: 226 mg/dL — ABNORMAL HIGH (ref 65–100)
Potassium: 3.8 mmol/L (ref 3.5–5.1)
Sodium: 137 mmol/L (ref 136–145)

## 2015-02-12 LAB — EKG, 12 LEAD, INITIAL
Atrial Rate: 98 {beats}/min
Calculated R Axis: 30 degrees
Calculated T Axis: 70 degrees
Q-T Interval: 338 ms
QRS Duration: 72 ms
QTC Calculation (Bezet): 442 ms
Ventricular Rate: 103 {beats}/min

## 2015-02-12 LAB — POC TROPONIN: Troponin-I (POC): 0 ng/ml (ref 0.0–0.08)

## 2015-02-12 LAB — BNP: BNP: 14 pg/mL

## 2015-02-12 MED ORDER — IPRATROPIUM-ALBUTEROL 2.5 MG-0.5 MG/3 ML NEB SOLUTION
2.5 mg-0.5 mg/3 ml | RESPIRATORY_TRACT | Status: AC
Start: 2015-02-12 — End: 2015-02-11
  Administered 2015-02-12: 01:00:00 via RESPIRATORY_TRACT

## 2015-02-12 MED FILL — IPRATROPIUM-ALBUTEROL 2.5 MG-0.5 MG/3 ML NEB SOLUTION: 2.5 mg-0.5 mg/3 ml | RESPIRATORY_TRACT | Qty: 3

## 2015-02-26 ENCOUNTER — Encounter: Attending: Family Medicine | Primary: Family Medicine

## 2015-03-20 ENCOUNTER — Encounter: Attending: Physician Assistant | Primary: Family Medicine

## 2015-03-23 ENCOUNTER — Encounter: Payer: MEDICARE | Attending: Critical Care Medicine | Primary: Family Medicine

## 2015-03-23 ENCOUNTER — Ambulatory Visit: Admit: 2015-03-23 | Discharge: 2015-03-23 | Payer: MEDICARE | Attending: Acute Care | Primary: Family Medicine

## 2015-03-23 ENCOUNTER — Encounter: Payer: MEDICARE | Attending: Physician Assistant | Primary: Family Medicine

## 2015-03-23 ENCOUNTER — Encounter: Attending: Physician Assistant | Primary: Family Medicine

## 2015-03-23 DIAGNOSIS — I2699 Other pulmonary embolism without acute cor pulmonale: Secondary | ICD-10-CM

## 2015-03-23 MED ORDER — ALBUTEROL SULFATE HFA 90 MCG/ACTUATION AEROSOL INHALER
90 mcg/actuation | RESPIRATORY_TRACT | Status: DC
Start: 2015-03-23 — End: 2015-04-04

## 2015-03-23 MED ORDER — NICOTINE 21 MG/24 HR DAILY PATCH
21 mg/24 hr | MEDICATED_PATCH | TRANSDERMAL | Status: DC
Start: 2015-03-23 — End: 2015-04-04

## 2015-03-23 MED ORDER — RIVAROXABAN 20 MG TAB
20 mg | ORAL_TABLET | Freq: Every day | ORAL | Status: DC
Start: 2015-03-23 — End: 2016-04-29

## 2015-03-23 MED ORDER — TIOTROPIUM BROMIDE 2.5 MCG/ACTUATION MIST FOR INHALATION
2.5 mcg/actuation | Freq: Every day | RESPIRATORY_TRACT | Status: DC
Start: 2015-03-23 — End: 2015-04-04

## 2015-03-23 NOTE — Patient Instructions (Signed)
He will begin Xarelto 15 mg twice daily for 3 weeks (samples provided), then Xarelto 20 mg daily.  This will be lifelong therapy.  Advised to contact us or seek emergent medical care for chest pain or sudden shortness of breath.  He will resume Spiriva Respimat, 2 inhalations once daily.  May use albuterol inhaler, 2 puffs 4 times daily if needed for shortness of breath or wheezing.  Smoking cessation is encouraged.  Prescription for nicotine patches provided.  Advised not to use patch and smokes homogeneously.  He will follow-up in 2 months with spirometry, sooner if needed.
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
## 2015-04-04 ENCOUNTER — Ambulatory Visit: Admit: 2015-04-04 | Discharge: 2015-04-04 | Payer: MEDICARE | Attending: Family Medicine | Primary: Family Medicine

## 2015-04-04 DIAGNOSIS — J449 Chronic obstructive pulmonary disease, unspecified: Secondary | ICD-10-CM

## 2015-04-04 MED ORDER — TRIAMCINOLONE ACETONIDE 0.1 % OINTMENT
0.1 % | Freq: Two times a day (BID) | CUTANEOUS | 0 refills | Status: DC
Start: 2015-04-04 — End: 2015-05-23
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
## 2015-04-10 ENCOUNTER — Emergency Department: Admit: 2015-04-11 | Payer: MEDICARE | Primary: Family Medicine

## 2015-04-10 DIAGNOSIS — J441 Chronic obstructive pulmonary disease with (acute) exacerbation: Secondary | ICD-10-CM

## 2015-04-10 NOTE — ED Triage Notes (Addendum)
C/o shortness of breath onset approx pta. Wheezing on ems arrival. sats 97% ra on ems arrival. Smoking on ems arrival. Albuterol 5mg  given by ems enroute. Out of bp meds x2 months. States seen by pmd last week however states told by md would not fill meds until lab work done. Scheduled lab work 9/7

## 2015-04-11 ENCOUNTER — Inpatient Hospital Stay
Admit: 2015-04-11 | Discharge: 2015-04-11 | Disposition: A | Discharge status: Home / Self Care | Payer: MEDICARE | Attending: Emergency Medicine

## 2015-04-11 LAB — METABOLIC PANEL, COMPREHENSIVE
A-G Ratio: 0.9 — ABNORMAL LOW (ref 1.2–3.5)
ALT (SGPT): 193 U/L — ABNORMAL HIGH (ref 12–65)
AST (SGOT): 101 U/L — ABNORMAL HIGH (ref 15–37)
Albumin: 3.5 g/dL (ref 3.2–4.6)
Alk. phosphatase: 160 U/L — ABNORMAL HIGH (ref 50–136)
Anion gap: 8 mmol/L (ref 7–16)
BUN: 18 MG/DL (ref 8–23)
Bilirubin, total: 0.4 MG/DL (ref 0.2–1.1)
CO2: 25 mmol/L (ref 21–32)
Calcium: 9.4 MG/DL (ref 8.3–10.4)
Chloride: 100 mmol/L (ref 98–107)
Creatinine: 1.3 MG/DL (ref 0.8–1.5)
GFR est AA: >60 mL/min/{1.73_m2} (ref >60)
GFR est non-AA: 59 mL/min/{1.73_m2} — ABNORMAL LOW (ref >60)
Globulin: 3.7 g/dL — ABNORMAL HIGH (ref 2.3–3.5)
Glucose: 253 mg/dL — ABNORMAL HIGH (ref 65–100)
Potassium: 4.1 mmol/L (ref 3.5–5.1)
Protein, total: 7.2 g/dL (ref 6.3–8.2)
Sodium: 133 mmol/L — ABNORMAL LOW (ref 136–145)

## 2015-04-11 LAB — EKG, 12 LEAD, INITIAL
Atrial Rate: 106 {beats}/min
Calculated P Axis: 60 degrees
Calculated R Axis: 48 degrees
Calculated T Axis: 70 degrees
P-R Interval: 146 ms
Q-T Interval: 326 ms
QRS Duration: 78 ms
QTC Calculation (Bezet): 433 ms
Ventricular Rate: 106 {beats}/min

## 2015-04-11 LAB — CBC W/O DIFF
HCT: 39.2 % — ABNORMAL LOW (ref 41.1–50.3)
HGB: 14.2 g/dL (ref 13.6–17.2)
MCH: 33.1 PG — ABNORMAL HIGH (ref 26.1–32.9)
MCHC: 36.2 g/dL — ABNORMAL HIGH (ref 31.4–35.0)
MCV: 91.4 FL (ref 79.6–97.8)
MPV: 10.2 FL — ABNORMAL LOW (ref 10.8–14.1)
PLATELET: 248 10*3/uL (ref 150–450)
RBC: 4.29 M/uL (ref 4.23–5.67)
RDW: 14.1 % (ref 11.9–14.6)
WBC: 7.3 10*3/uL (ref 4.3–11.1)

## 2015-04-11 LAB — POC TROPONIN
Troponin-I (POC): 0 ng/ml (ref 0.0–0.08)
Troponin-I (POC): 0 ng/ml (ref 0.0–0.08)

## 2015-04-11 LAB — PROTHROMBIN TIME + INR
INR: 1 (ref 0.9–1.2)
Prothrombin time: 10.8 s (ref 9.6–12.0)

## 2015-04-11 LAB — TROPONIN I: Troponin-I, Qt.: <0.02 NG/ML — ABNORMAL LOW (ref 0.02–0.05)

## 2015-04-11 MED ORDER — SODIUM CHLORIDE 0.9 % IJ SYRG
Freq: Three times a day (TID) | INTRAMUSCULAR | Status: DC
Start: 2015-04-11 — End: 2015-04-11

## 2015-04-11 MED ORDER — SODIUM CHLORIDE 0.9 % IJ SYRG
INTRAMUSCULAR | Status: DC | PRN
Start: 2015-04-11 — End: 2015-04-11

## 2015-04-11 MED ORDER — METHYLPREDNISOLONE (PF) 125 MG/2 ML IJ SOLR
125 mg/2 mL | Freq: Once | INTRAMUSCULAR | Status: AC
Start: 2015-04-11 — End: 2015-04-10
  Administered 2015-04-10: 05:00:00 via INTRAVENOUS

## 2015-04-11 MED ORDER — IPRATROPIUM-ALBUTEROL 2.5 MG-0.5 MG/3 ML NEB SOLUTION
2.5 mg-0.5 mg/3 ml | RESPIRATORY_TRACT | Status: AC
Start: 2015-04-11 — End: 2015-04-11
  Administered 2015-04-11: 05:00:00 via RESPIRATORY_TRACT

## 2015-04-11 MED ORDER — IPRATROPIUM-ALBUTEROL 2.5 MG-0.5 MG/3 ML NEB SOLUTION
2.5 mg-0.5 mg/3 ml | RESPIRATORY_TRACT | Status: AC
Start: 2015-04-11 — End: 2015-04-11
  Administered 2015-04-11: 08:00:00 via RESPIRATORY_TRACT

## 2015-04-11 MED ORDER — PREDNISONE 20 MG TAB
20 mg | ORAL_TABLET | Freq: Every day | ORAL | 0 refills | Status: AC
Start: 2015-04-11 — End: 2015-04-15

## 2015-04-11 MED FILL — SOLU-MEDROL (PF) 125 MG/2 ML SOLUTION FOR INJECTION: 125 mg/2 mL | INTRAMUSCULAR | Qty: 2

## 2015-04-11 MED FILL — IPRATROPIUM-ALBUTEROL 2.5 MG-0.5 MG/3 ML NEB SOLUTION: 2.5 mg-0.5 mg/3 ml | RESPIRATORY_TRACT | Qty: 3

## 2015-04-11 NOTE — ED Notes (Signed)
I have reviewed discharge instructions with the patient. Patient verbalizes understanding. Opportunity for questions provided. Prescriptions in hand. Patient ambulatory off the unit. No distress noted at this time.

## 2015-04-11 NOTE — Progress Notes (Signed)
Seen in er - needs fu appt.

## 2015-04-11 NOTE — ED Provider Notes (Signed)
Patient is a 64 y.o. male presenting with shortness of breath. The history is provided by the patient.   Shortness of Breath   This is a recurrent problem. The average episode lasts 1 day. The problem occurs intermittently.The problem has been gradually worsening. Associated symptoms include cough. Pertinent negatives include no fever, no headaches, no coryza, no rhinorrhea, no sore throat, no sputum production, no chest pain, no vomiting, no abdominal pain, no leg pain and no leg swelling. He has tried beta-agonist inhalers for the symptoms. The treatment provided mild relief. He has had prior hospitalizations. He has had prior ED visits. Associated medical issues include COPD.        Past Medical History:   Diagnosis Date   ??? Allergic rhinitis    ??? Asthma    ??? Cancer ([facility])      skin   ??? COPD      emphysema   ??? Diabetes ([facility])      NIDDM   ??? HLD (hyperlipidemia)    ??? Hypertension    ??? Obesity due to excess calories 02/05/2015   ??? Other ill-defined conditions(799.89)      DVT ble/ lue   ??? Psychiatric disorder      bipolar   ??? Seizure ([facility])    ??? Skin cancer      nose   ??? Sleep disorder    ??? 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 Pr cardiac surg procedure unlist       heart cath 2007   ??? Hx other surgical       abdominal obstruction in 2012   ??? Hx cataract removal Left 2012         Family History:   Problem Relation Age of Onset   ??? Heart Disease Mother    ??? Diabetes Mother    ??? Cancer Father    ??? Hypertension Sister    ??? Thyroid Disease Sister    ??? Lung Disease Neg Hx        Social History     Social History   ??? Marital status: DIVORCED     Spouse name: N/A   ??? Number of children: N/A   ??? Years of education: N/A     Occupational History   ??? Not on file.     Social History Main Topics   ??? Smoking status: Current Every Day Smoker     Packs/day: 1.00     Years: 55.00     Types: Cigarettes   ??? Smokeless tobacco: Never Used       Comment: up to 2 - 3 ppd   ??? Alcohol use 0.0 oz/week     0 Standard drinks or equivalent per week   ??? Drug use: Yes     Special: Marijuana   ??? Sexual activity: Not Currently     Other Topics Concern   ??? Not on file     Social History Narrative         ALLERGIES: Latex, natural rubber; Other food; Grapefruit; and Haldol [haloperidol lactate]    Review of Systems   Constitutional: Negative for chills and fever.   HENT: Negative for congestion, rhinorrhea and sore throat.    Eyes: Negative for photophobia and redness.   Respiratory: Positive for cough and shortness of breath. Negative for sputum production.    Cardiovascular: Negative  for chest pain and leg swelling.   Gastrointestinal: Negative for abdominal pain, diarrhea, nausea and vomiting.   Endocrine: Negative for polydipsia and polyuria.   Genitourinary: Negative for dysuria.   Musculoskeletal: Negative for back pain and myalgias.   Neurological: Negative for weakness, numbness and headaches.       Vitals:    04/11/15 0220 04/11/15 0240 04/11/15 0300 04/11/15 0320   BP: 172/81 162/81 179/87 166/78   Pulse: 92 90 99 91   Resp: _0 Temp:       SpO2: 93% 94% 94% 92%   Weight:       Height:                Physical Exam   Constitutional: He is oriented to person, place, and time. He appears well-developed and well-nourished.   HENT:   Mouth/Throat: Oropharynx is clear and moist. No oropharyngeal exudate.   Eyes: Conjunctivae are normal. Pupils are equal, round, and reactive to light.   Neck: Neck supple.   Cardiovascular: Normal rate, regular rhythm and normal heart sounds.    Pulmonary/Chest: Effort normal. He has wheezes.   Abdominal: Soft. Bowel sounds are normal. He exhibits no distension. There is no tenderness. There is no rebound and no guarding.   Musculoskeletal: Normal range of motion. He exhibits no edema or tenderness.   Lymphadenopathy:     He has no cervical adenopathy.   Neurological: He is alert and oriented to person, place, and time.    Skin: Skin is warm and dry.   Nursing note and vitals reviewed.       MDM  Number of Diagnoses or Management Options  Diagnosis management comments: COPD exacerbation.  Rule out MI.  Wheezing improved after neb prior to arrival and began after DuoNeb here in the emergency department.  Chest x-ray negative for pneumonia or other acute process.  DC home on steroid burst if patient rules out for MI and oxygen saturations and respiratory status remain appropriate for discharge.       Amount and/or Complexity of Data Reviewed  Clinical lab tests: ordered and reviewed (Results for orders placed or performed during the hospital encounter of 04/10/15  -CBC W/O DIFF       Result                                            Value                         Ref Range                       WBC                                               7.3                           4.3 - 11.1 K/uL                 RBC  4.29                          4.23 - 5.67 M/uL                HGB                                               14.2                          13.6 - 17.2 g/dL                HCT                                               39.2 (L)                      41.1 - 50.3 %                   MCV                                               91.4                          79.6 - 97.8 FL                  MCH                                               33.1 (H)                      26.1 - 32.9 PG                  MCHC                                              36.2 (H)                      31.4 - 35.0 g/dL                RDW                                               14.1                          11.9 - 14.6 %                   PLATELET  248                           150 - 450 K/uL                  MPV                                               10.2 (L)                      10.8 - 14.1 FL             -METABOLIC PANEL, COMPREHENSIVE        Result                                            Value                         Ref Range                       Sodium                                            133 (L)                       136 - 145 mmol/L                Potassium                                         4.1                           3.5 - 5.1 mmol/L                Chloride                                          100                           98 - 107 mmol/L                 CO2                                               25                            21 - 32 mmol/L                  Anion gap  8                             7 - 16 mmol/L                   Glucose                                           253 (H)                       65 - 100 mg/dL                  BUN                                               18                            8 - 23 MG/DL                    Creatinine                                        1.30                          0.8 - 1.5 MG/DL                 GFR est AA                                        >60                           >60 ml/min/1.52m               GFR est non-AA                                    59 (L)                        >60 ml/min/1.728m              Calcium                                           9.4                           8.3 - 10.4 MG/DL                Bilirubin, total  0.4                           0.2 - 1.1 MG/DL                 ALT                                               193 (H)                       12 - 65 U/L                     AST                                               101 (H)                       15 - 37 U/L                     Alk. phosphatase                                  160 (H)                       50 - 136 U/L                    Protein, total                                    7.2                           6.3 - 8.2 g/dL                   Albumin                                           3.5                           3.2 - 4.6 g/dL                  Globulin                                          3.7 (H)                       2.3 - 3.5 g/dL                  A-G Ratio  0.9 (L)                       1.2 - 3.5                  -PROTHROMBIN TIME + INR       Result                                            Value                         Ref Range                       Prothrombin time                                  10.8                          9.6 - 12.0 sec                  INR                                               1.0                           0.9 - 1.2                  -TROPONIN I       Result                                            Value                         Ref Range                       Troponin-I, Qt.                                   <0.02 (L)                     0.02 - 0.05 NG/ML          -POC TROPONIN-I       Result                                            Value                         Ref Range                       Troponin-I (POC)  0                             0.0 - 0.08 ng/ml           -EKG, 12 LEAD, INITIAL       Result                                            Value                         Ref Range                       Systolic BP                                                                     mmHg                            Diastolic BP                                                                    mmHg                            Ventricular Rate                                  106                           BPM                             Atrial Rate                                       106                           BPM                             P-R Interval                                      146                           ms  QRS Duration                                      78                             ms                              Q-T Interval                                      326                           ms                              QTC Calculation (Bezet)                           433                           ms                              Calculated P Axis                                 60                            degrees                         Calculated R Axis                                 48                            degrees                         Calculated T Axis                                 70                            degrees                         Diagnosis                                                                                                   !!  AGE AND GENDER SPECIFIC ECG ANALYSIS !!   Sinus tachycardia   Nonspecific T wave abnormality   Abnormal ECG   When compared with ECG of 11-Feb-2015 21:08,   No significant change was found     )  Tests in the radiology section of CPT??: ordered and reviewed (Xr Chest Port    Result Date: 04/11/2015  Portable chest xray    COMPARISON: February 11, 2015  INDICATION: Shortness of breath.  FINDINGS:   There is minimal left basilar atelectasis. No focal consolidation, pleural effusion or pneumothorax. No pulmonary edema. Cardiac mediastinal contour is within normal limits. Surrounding bones are intact.     IMPRESSION:  No acute pulmonary disease.     )      ED Course       Procedures

## 2015-04-11 NOTE — ED Notes (Signed)
1st poc-troponin had an cartridge error. Called lab to add on trop to blood that they already have. 2nd poc-trop running.

## 2015-04-12 ENCOUNTER — Ambulatory Visit: Admit: 2015-04-12 | Discharge: 2015-04-12 | Payer: MEDICARE | Attending: Family Medicine | Primary: Family Medicine

## 2015-04-12 DIAGNOSIS — J441 Chronic obstructive pulmonary disease with (acute) exacerbation: Secondary | ICD-10-CM

## 2015-04-12 LAB — HEPATIC FUNCTION PANEL
ALT (SGPT): 153 IU/L — ABNORMAL HIGH (ref 10–35)
AST (SGOT): 65 IU/L — ABNORMAL HIGH (ref 16–40)
Albumin: 4.1 g/dl (ref 3.2–5.0)
Alk. phosphatase: 109 U/L (ref 31–130)
Bilirubin, direct: 0.1 mg/dl (ref 0.0–0.4)
Bilirubin, total: 0.5 mg/dl (ref 0.4–1.4)
Protein, total: 6.6 g/dl (ref 6.0–8.5)

## 2015-04-12 MED ORDER — DIVALPROEX 250 MG TAB, DELAYED RELEASE
250 mg | ORAL_TABLET | Freq: Three times a day (TID) | ORAL | 0 refills | Status: DC
Start: 2015-04-12 — End: 2015-06-22

## 2015-04-12 MED ORDER — METFORMIN 500 MG TAB
500 mg | ORAL_TABLET | Freq: Two times a day (BID) | ORAL | 3 refills | Status: DC
Start: 2015-04-12 — End: 2015-06-22

## 2015-04-12 MED ORDER — SITAGLIPTIN 100 MG TAB
100 mg | ORAL_TABLET | Freq: Every day | ORAL | 3 refills | Status: DC
Start: 2015-04-12 — End: 2015-06-22

## 2015-04-12 NOTE — Progress Notes (Signed)
Per Dr. Pasty ArchNaidu's instruction, patient was called to offer an emergency room visit follow up.    I scheduled an emergency room visit follow up with Dr. Fredrik RiggerNaidu on 04/12/15 at 2:15 pm.

## 2015-04-12 NOTE — Progress Notes (Signed)
[facility]  Darlys Gales, M.D.  Family Medicine  [address]  [place], [place] [address]  Office : [phone]  Fax : [phone]        Chief Complaint   Patient presents with   ??? COPD     ER follow-up after exacerbation   ??? Bipolar   ??? Diabetes         HISTORY OF PRESENT ILLNESS:     Patrick Paul presents for evaluation of above problems  Recently seen in the emergency room for COPD exacerbation.  He was nebulized and treated with prednisone.  Feeling better.  Some nonproductive cough.  No fever or chills.  Blood sugar has been running high.  Since his return from [place] he has not been on any medication.  Scheduled to have his HbA1c checked.  Random blood sugar done in the emergency room was over 200.  He has bipolar disorder and has an appointment with psychiatry next month.  Sister is concerned that he is is not in any medication at all at the moment.  Was on lithium but had to be treated for toxic level.  Records from emergency room received and reviewed in detail    ROS:     Cardiovascular ROS: no chest pain or dyspnea on exertion  Gastrointestinal ROS: no abdominal pain, change in bowel habits, or black or bloody stools      Past Medical History   Diagnosis Date   ??? Allergic rhinitis    ??? Asthma    ??? Cancer ([facility])      skin   ??? COPD      emphysema   ??? Diabetes ([facility])      NIDDM   ??? HLD (hyperlipidemia)    ??? Hypertension    ??? Obesity due to excess calories 02/05/2015   ??? Other ill-defined conditions(799.89)      DVT ble/ lue   ??? Psychiatric disorder      bipolar   ??? Seizure ([facility])    ??? Skin cancer      nose   ??? Sleep disorder    ??? Thromboembolus ([facility])      subclavian vein in 2007 + lower limb     Past Surgical History   Procedure Laterality Date   ??? Pr cardiac surg procedure unlist       heart cath 2007   ??? Hx other surgical       abdominal obstruction in 2012   ??? Hx cataract removal Left 2012     Social History     Social History   ??? Marital status: DIVORCED     Spouse name: N/A    ??? Number of children: N/A   ??? Years of education: N/A     Social History Main Topics   ??? Smoking status: Current Every Day Smoker     Packs/day: 1.00     Years: 55.00     Types: Cigarettes   ??? Smokeless tobacco: Never Used      Comment: up to 2 - 3 ppd   ??? Alcohol use 0.0 oz/week     0 Standard drinks or equivalent per week   ??? Drug use: Yes     Special: Marijuana   ??? Sexual activity: Not Currently     Other Topics Concern   ??? None     Social History Narrative     Current Outpatient Prescriptions on File Prior to Visit   Medication Sig Dispense Refill   ???  predniSONE (DELTASONE) 20 mg tablet Take 3 Tabs by mouth daily for 4 days. 12 Tab 0   ??? rivaroxaban (XARELTO) 15 mg tab tablet Take  by mouth daily.     ??? tiotropium (SPIRIVA WITH HANDIHALER) 18 mcg inhalation capsule Take 1 Cap by inhalation daily.     ??? albuterol (VENTOLIN HFA) 90 mcg/actuation inhaler Take  by inhalation.     ??? triamcinolone acetonide (KENALOG) 0.1 % ointment Apply  to affected area two (2) times a day. 453.6 g 0   ??? fenofibrate nanocrystallized (TRICOR) 145 mg tablet Take  by mouth daily.     ??? rivaroxaban (XARELTO) 20 mg tab tablet Take 1 Tab by mouth daily (with breakfast). 30 Tab 11   ??? lithium carbonate 300 mg capsule Take 1 Cap by mouth two (2) times daily (with meals). (Patient taking differently: Take 600 mg by mouth two (2) times daily (with meals).) 30 Cap 0   ??? gabapentin (NEURONTIN) 400 mg capsule Take 400 mg by mouth three (3) times daily.       No current facility-administered medications on file prior to visit.        PHYSICAL EXAM  Visit Vitals   ??? BP 146/88 (BP 1 Location: Left arm)   ??? Pulse 92   ??? Ht 5' 10.5" (1.791 m)   ??? Wt 231 lb 3.2 oz (104.9 kg)   ??? BMI 32.7 kg/m2       Chest - clear to auscultation, no wheezes, rales or rhonchi, symmetric air entry  Heart - normal rate, regular rhythm, normal S1, S2, no murmurs, rubs, clicks or gallops    ASSESSMENT and PLAN    Orders Placed This Encounter    ??? Hepatitis Panel, Acute ([facility])   ??? Liver Panel   ??? divalproex DR (DEPAKOTE) 250 mg tablet   ??? sitaGLIPtin (JANUVIA) 100 mg tablet   ??? metFORMIN (GLUCOPHAGE) 500 mg tablet     lab results and schedule of future lab studies reviewed with patient  reviewed medications and side effects in detail  radiology results and schedule of future radiology studies reviewed with patient  ?? COPD exacerbation--better.  Continue medication  ?? Bipolar disorder--we will start Depakote 250 mg 3 times daily.  Keep appointment with psychiatry  ?? DM 2--we will start Januvia 100 mg daily and metformin 500 mg twice daily.  ?? Keep follow-up appointment    Darlys Gales MD    Elements of this note have been dictated using speech recognition software. Although reviewed,  errors of speech recognition may have occurred.

## 2015-04-13 LAB — HEPATITIS PANEL, ACUTE
Hep B Core Ab, IgM: NEGATIVE
Hep B surface Ag screen: NEGATIVE
Hep C Virus Ab: <0.1 s/co ratio (ref 0.0–0.9)
Hepatitis A Ab, IgM: NEGATIVE

## 2015-04-20 ENCOUNTER — Emergency Department: Admit: 2015-04-20 | Payer: MEDICARE | Primary: Family Medicine

## 2015-04-20 ENCOUNTER — Inpatient Hospital Stay
Admit: 2015-04-20 | Discharge: 2015-04-20 | Disposition: A | Discharge status: Home / Self Care | Payer: MEDICARE | Attending: Emergency Medicine

## 2015-04-20 DIAGNOSIS — R0602 Shortness of breath: Secondary | ICD-10-CM

## 2015-04-20 LAB — METABOLIC PANEL, COMPREHENSIVE
A-G Ratio: 1 — ABNORMAL LOW (ref 1.2–3.5)
ALT (SGPT): 191 U/L — ABNORMAL HIGH (ref 12–65)
AST (SGOT): 73 U/L — ABNORMAL HIGH (ref 15–37)
Albumin: 3.4 g/dL (ref 3.2–4.6)
Alk. phosphatase: 132 U/L (ref 50–136)
Anion gap: 12 mmol/L (ref 7–16)
BUN: 11 MG/DL (ref 8–23)
Bilirubin, total: 0.4 MG/DL (ref 0.2–1.1)
CO2: 23 mmol/L (ref 21–32)
Calcium: 9 MG/DL (ref 8.3–10.4)
Chloride: 100 mmol/L (ref 98–107)
Creatinine: 1.37 MG/DL (ref 0.8–1.5)
GFR est AA: >60 mL/min/{1.73_m2} (ref >60)
GFR est non-AA: 56 mL/min/{1.73_m2} — ABNORMAL LOW (ref >60)
Globulin: 3.4 g/dL (ref 2.3–3.5)
Glucose: 228 mg/dL — ABNORMAL HIGH (ref 65–100)
Potassium: 3.5 mmol/L (ref 3.5–5.1)
Protein, total: 6.8 g/dL (ref 6.3–8.2)
Sodium: 135 mmol/L — ABNORMAL LOW (ref 136–145)

## 2015-04-20 LAB — CBC WITH AUTOMATED DIFF
ABS. BASOPHILS: 0 10*3/uL (ref 0.0–0.2)
ABS. EOSINOPHILS: 0.2 10*3/uL (ref 0.0–0.8)
ABS. IMM. GRANS.: 0.1 10*3/uL (ref 0.0–0.5)
ABS. LYMPHOCYTES: 2.7 10*3/uL (ref 0.5–4.6)
ABS. MONOCYTES: 0.9 10*3/uL (ref 0.1–1.3)
ABS. NEUTROPHILS: 4.7 10*3/uL (ref 1.7–8.2)
BASOPHILS: 1 % (ref 0.0–2.0)
EOSINOPHILS: 3 % (ref 0.5–7.8)
HCT: 41.8 % (ref 41.1–50.3)
HGB: 14.8 g/dL (ref 13.6–17.2)
IMMATURE GRANULOCYTES: 1.6 % (ref 0.0–5.0)
LYMPHOCYTES: 31 % (ref 13–44)
MCH: 32.6 PG (ref 26.1–32.9)
MCHC: 35.4 g/dL — ABNORMAL HIGH (ref 31.4–35.0)
MCV: 92.1 FL (ref 79.6–97.8)
MONOCYTES: 10 % (ref 4.0–12.0)
MPV: 10.1 FL — ABNORMAL LOW (ref 10.8–14.1)
NEUTROPHILS: 53 % (ref 43–78)
PLATELET: 218 10*3/uL (ref 150–450)
RBC: 4.54 M/uL (ref 4.23–5.67)
RDW: 13.7 % (ref 11.9–14.6)
WBC: 8.7 10*3/uL (ref 4.3–11.1)

## 2015-04-20 LAB — EKG, 12 LEAD, INITIAL
Atrial Rate: 109 {beats}/min
Calculated P Axis: 52 degrees
Calculated R Axis: 27 degrees
Calculated T Axis: 66 degrees
P-R Interval: 138 ms
Q-T Interval: 322 ms
QRS Duration: 76 ms
QTC Calculation (Bezet): 433 ms
Ventricular Rate: 109 {beats}/min

## 2015-04-20 LAB — LIPASE: Lipase: 136 U/L (ref 73–393)

## 2015-04-20 MED ORDER — LISINOPRIL 5 MG TAB
5 mg | Freq: Once | ORAL | Status: AC
Start: 2015-04-20 — End: 2015-04-20
  Administered 2015-04-20: 06:00:00 via ORAL

## 2015-04-20 MED ORDER — ALBUTEROL SULFATE 0.083 % (0.83 MG/ML) SOLN FOR INHALATION
2.5 mg /3 mL (0.083 %) | RESPIRATORY_TRACT | Status: DC
Start: 2015-04-20 — End: 2015-04-20

## 2015-04-20 MED ORDER — ALBUTEROL SULFATE 0.083 % (0.83 MG/ML) SOLN FOR INHALATION
2.5 mg /3 mL (0.083 %) | RESPIRATORY_TRACT | Status: AC
Start: 2015-04-20 — End: 2015-04-20
  Administered 2015-04-20: 06:00:00 via RESPIRATORY_TRACT

## 2015-04-20 MED ORDER — ALBUTEROL SULFATE HFA 90 MCG/ACTUATION AEROSOL INHALER
90 mcg/actuation | Freq: Four times a day (QID) | RESPIRATORY_TRACT | 0 refills | Status: DC | PRN
Start: 2015-04-20 — End: 2015-05-23

## 2015-04-20 MED ORDER — LISINOPRIL 20 MG TAB
20 mg | ORAL_TABLET | Freq: Every day | ORAL | 1 refills | Status: DC
Start: 2015-04-20 — End: 2016-01-09

## 2015-04-20 MED ORDER — LISINOPRIL 5 MG TAB
5 mg | ORAL | Status: DC
Start: 2015-04-20 — End: 2015-04-20

## 2015-04-20 MED FILL — LISINOPRIL 5 MG TAB: 5 mg | ORAL | Qty: 4

## 2015-04-20 MED FILL — ALBUTEROL SULFATE 0.083 % (0.83 MG/ML) SOLN FOR INHALATION: 2.5 mg /3 mL (0.083 %) | RESPIRATORY_TRACT | Qty: 1

## 2015-04-20 MED FILL — ALBUTEROL SULFATE 0.083 % (0.83 MG/ML) SOLN FOR INHALATION: 2.5 mg /3 mL (0.083 %) | RESPIRATORY_TRACT | Qty: 3

## 2015-04-20 NOTE — ED Notes (Signed)
I have reviewed discharge instructions with the patient.  The patient verbalized understanding.

## 2015-04-20 NOTE — ED Triage Notes (Signed)
C/o shortness of breath onset approx 2300 tonight after smoking cigarette. States smoking "makes me breathe better". Pt found smoking by ems. sats 93% ra on their arrival. Wheezing on ems arrival.  Also c/o left sided chest pain, resolved after respiratory treatment with ems. Albuterol  given by ems, nitroglycerin x1 and asa  given by ems. Denies chest pain on arrival. bgl 207 with ems. Pt also states "i hadnt slept in 2 years. i just close my eyes but i dont sleep"

## 2015-04-20 NOTE — ED Notes (Signed)
Called lab... Pt has blood in lab

## 2015-04-20 NOTE — ED Provider Notes (Signed)
Patient is a 64 y.o. male presenting with shortness of breath. The history is provided by the patient.   Shortness of Breath   This is a recurrent problem. The problem occurs frequently.The current episode started 1 to 2 hours ago. The problem has been resolved. Associated symptoms include cough, wheezing and chest pain. Pertinent negatives include no fever, no headaches, no rhinorrhea, no sore throat, no sputum production, no hemoptysis, no PND, no orthopnea, no vomiting, no abdominal pain, no rash, no leg pain and no leg swelling. Associated medical issues include COPD and DVT.        Past Medical History:   Diagnosis Date   ??? Allergic rhinitis    ??? Asthma    ??? Cancer ([facility])      skin   ??? COPD      emphysema   ??? Diabetes ([facility])      NIDDM   ??? HLD (hyperlipidemia)    ??? Hypertension    ??? Obesity due to excess calories 02/05/2015   ??? Other ill-defined conditions(799.89)      DVT ble/ lue   ??? Psychiatric disorder      bipolar   ??? Seizure ([facility])    ??? Skin cancer      nose   ??? Sleep disorder    ??? 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 Pr cardiac surg procedure unlist       heart cath 2007   ??? Hx other surgical       abdominal obstruction in 2012   ??? Hx cataract removal Left 2012         Family History:   Problem Relation Age of Onset   ??? Heart Disease Mother    ??? Diabetes Mother    ??? Cancer Father    ??? Hypertension Sister    ??? Thyroid Disease Sister    ??? Lung Disease Neg Hx        Social History     Social History   ??? Marital status: DIVORCED     Spouse name: N/A   ??? Number of children: N/A   ??? Years of education: N/A     Occupational History   ??? Not on file.     Social History Main Topics   ??? Smoking status: Current Every Day Smoker     Packs/day: 1.00     Years: 55.00     Types: Cigarettes   ??? Smokeless tobacco: Never Used      Comment: up to 2 - 3 ppd   ??? Alcohol use 0.0 oz/week     0 Standard drinks or equivalent per week    ??? Drug use: Yes     Special: Marijuana   ??? Sexual activity: Not Currently     Other Topics Concern   ??? Not on file     Social History Narrative         ALLERGIES: Latex, natural rubber; Other food; Grapefruit; and Haldol [haloperidol lactate]    Review of Systems   Unable to perform ROS  Constitutional: Negative for chills and fever.   HENT: Negative for rhinorrhea and sore throat.    Eyes: Negative for discharge and redness.   Respiratory: Positive for cough, shortness of breath and wheezing. Negative for hemoptysis and sputum production.    Cardiovascular: Positive for chest pain. Negative for palpitations,  orthopnea, leg swelling and PND.   Gastrointestinal: Positive for abdominal distention. Negative for abdominal pain, diarrhea, nausea and vomiting.   Musculoskeletal: Negative for arthralgias and back pain.   Skin: Negative for rash.   Neurological: Negative for dizziness and headaches.   All other systems reviewed and are negative.      Vitals:    04/20/15 0411 04/20/15 0414 04/20/15 0421 04/20/15 0422   BP:       Pulse: 93  98 98   Resp: 21  22 20    Temp:       SpO2: 100% 100%     Weight:       Height:                Physical Exam   Constitutional: He is oriented to person, place, and time. He appears well-developed and well-nourished. No distress.   HENT:   Head: Normocephalic and atraumatic.   Eyes: Conjunctivae and EOM are normal. Pupils are equal, round, and reactive to light. Right eye exhibits no discharge. Left eye exhibits no discharge. No scleral icterus.   Neck: Normal range of motion. Neck supple.   Cardiovascular: Normal rate, regular rhythm and normal heart sounds.  Exam reveals no gallop.    No murmur heard.  Pulmonary/Chest: Effort normal. No respiratory distress. He has wheezes. He has no rales.   Abdominal: Soft. Bowel sounds are normal. He exhibits distension. There is no tenderness. There is no guarding.   Musculoskeletal: Normal range of motion. He exhibits no edema.    Neurological: He is alert and oriented to person, place, and time. He exhibits normal muscle tone.   cni 2-12 grossly   Skin: Skin is warm and dry. He is not diaphoretic.   Psychiatric: He has a normal mood and affect. His behavior is normal.   Nursing note and vitals reviewed.       MDM  Number of Diagnoses or Management Options  Secondary hypertension, hypertension with unspecified goal:   SOB (shortness of breath):   Diagnosis management comments: Medical decision making note:  Copd with dyspnea, was smoking as ems arrived  betetr since neb  Out of blood pressure pills  testss ok  rx  f/u  This concludes the "medical decision making note" part of this emergency department visit note.      ED Course       Procedures

## 2015-04-21 LAB — HEPATITIS PANEL, ACUTE
Hep B Core Ab, IgM: NEGATIVE
Hep B surface Ag screen: NEGATIVE
Hep C Virus Ab: <0.1 s/co ratio (ref 0.0–0.9)
Hepatitis A Ab, IgM: NEGATIVE

## 2015-04-23 NOTE — Progress Notes (Signed)
Seen in er - needs fu appt.

## 2015-04-24 NOTE — Progress Notes (Signed)
Per Dr. Pasty Arch instruction, patient was called to offer an emergency room visit follow up.    I called and spoke with patient's sister, Patrick Paul, who states that she and her brother declines a hospital follow up visit at this time, as Mr. Patrick Paul has an upcoming appointment with Dr. Fredrik Rigger on the 14th of this month at 9:30 am and they will follow up at that time.

## 2015-04-25 ENCOUNTER — Encounter: Admit: 2015-04-25 | Discharge: 2015-04-25 | Payer: MEDICARE | Primary: Family Medicine

## 2015-04-25 DIAGNOSIS — E782 Mixed hyperlipidemia: Secondary | ICD-10-CM

## 2015-04-25 LAB — HEPATIC FUNCTION PANEL
ALT (SGPT): 133 IU/L — ABNORMAL HIGH (ref 10–35)
AST (SGOT): 60 IU/L — ABNORMAL HIGH (ref 16–40)
Albumin: 3.9 g/dl (ref 3.2–5.0)
Alk. phosphatase: 87 U/L (ref 31–130)
Bilirubin, direct: 0.1 mg/dl (ref 0.0–0.4)
Bilirubin, total: 0.5 mg/dl (ref 0.4–1.4)
Protein, total: 6.5 g/dl (ref 6.0–8.5)

## 2015-04-25 LAB — LIPID PANEL
CHOL/HDL Ratio: 7.2 — ABNORMAL HIGH (ref 0.0–6.7)
Cholesterol, total: 322 mg/dl — ABNORMAL HIGH (ref 0–200)
HDL Cholesterol: 45 mg/dl (ref >40)
LDL, calculated: 62 mg/dL (ref 0–130)
Triglyceride: 1076 mg/dl — ABNORMAL HIGH (ref 0–200)
VLDL, calculated: 215 mg/dL — ABNORMAL HIGH (ref 0–40)

## 2015-04-25 LAB — HEMOGLOBIN A1C W/O EAG: Hemoglobin A1c: 7.4 % — ABNORMAL HIGH (ref 4.6–5.6)

## 2015-04-25 LAB — TSH 3RD GENERATION: TSH: 1.03 u[IU]/mL (ref 0.47–5.01)

## 2015-04-27 MED ORDER — FENOFIBRATE NANOCRYSTALLIZED 145 MG TAB
145 mg | ORAL_TABLET | Freq: Every day | ORAL | 3 refills | Status: DC
Start: 2015-04-27 — End: 2016-01-09

## 2015-04-27 NOTE — Progress Notes (Signed)
Is he taking tricor 145 mg daily. If not we will send a prescription for him to start doing so. Triglycerides very elevated

## 2015-04-27 NOTE — Progress Notes (Signed)
Pt sister notified that the pt triglycerides were elevated and that a script was sent in for tricor 145 mg.

## 2015-05-02 ENCOUNTER — Ambulatory Visit: Admit: 2015-05-02 | Discharge: 2015-05-02 | Payer: MEDICARE | Attending: Family Medicine | Primary: Family Medicine

## 2015-05-02 DIAGNOSIS — J441 Chronic obstructive pulmonary disease with (acute) exacerbation: Secondary | ICD-10-CM

## 2015-05-02 NOTE — Progress Notes (Signed)
[facility]  Darlys Gales, M.D.  Family Medicine  [address]  [place], [place] [address]  Office : [phone]  Fax : [phone]        Chief Complaint   Patient presents with   ??? Cholesterol Problem   ??? Bipolar   ??? Elevated Liver Enzymes   ??? COPD   ??? Diabetes         HISTORY OF PRESENT ILLNESS:     Patrick Paul presents for evaluation of above problems  Recently seen in the emergency room for COPD exacerbation.  Nebulized and discharged on medication.  He has a follow-up appointment with pulmonology.  Less wheezing.  Seen by mental health and started on Seroquel.  Also on Depakote.  Has follow-up appointment with mental health.  So far tolerating medications well.  His labs showed an extremely high triglyceride of 1076.  He was started on Trilipix.  Low-cholesterol diet discussed.  LDL was normal at 62.  His HbA1c was 7.4.  He is on Januvia and Metformin.  Blood sugar in the 120 fasting range.  No hypoglycemic episodes since his last visit.  His transaminase level has been high with an ALT of 133 and an AST of 60 no history of jaundice.  Hepatitis panel was negative.    ROS:     Cardiovascular ROS: no chest pain or dyspnea on exertion  Gastrointestinal ROS: no abdominal pain, change in bowel habits, or black or bloody stools      Past Medical History   Diagnosis Date   ??? Allergic rhinitis    ??? Asthma    ??? Cancer (HCC)      skin   ??? COPD      emphysema   ??? Diabetes (HCC)      NIDDM   ??? HLD (hyperlipidemia)    ??? Hypertension    ??? Obesity due to excess calories 02/05/2015   ??? Other ill-defined conditions(799.89)      DVT ble/ lue   ??? Psychiatric disorder      bipolar   ??? Seizure (HCC)    ??? Skin cancer      nose   ??? Sleep disorder    ??? Thromboembolus (HCC)      subclavian vein in 2007 + lower limb     Past Surgical History   Procedure Laterality Date   ??? Pr cardiac surg procedure unlist       heart cath 2007   ??? Hx other surgical       abdominal obstruction in 2012   ??? Hx cataract removal Left 2012      Social History     Social History   ??? Marital status: DIVORCED     Spouse name: N/A   ??? Number of children: N/A   ??? Years of education: N/A     Social History Main Topics   ??? Smoking status: Current Every Day Smoker     Packs/day: 1.00     Years: 55.00     Types: Cigarettes   ??? Smokeless tobacco: Never Used      Comment: up to 2 - 3 ppd   ??? Alcohol use 0.0 oz/week     0 Standard drinks or equivalent per week   ??? Drug use: Yes     Special: Marijuana   ??? Sexual activity: Not Currently     Other Topics Concern   ??? None     Social History Narrative     Current  Outpatient Prescriptions on File Prior to Visit   Medication Sig Dispense Refill   ??? fenofibrate nanocrystallized (TRICOR) 145 mg tablet Take 1 Tab by mouth daily. 90 Tab 3   ??? lisinopril (PRINIVIL, ZESTRIL) 20 mg tablet Take 1 Tab by mouth daily. 31 Tab 1   ??? albuterol (PROVENTIL HFA, VENTOLIN HFA, PROAIR HFA) 90 mcg/actuation inhaler Take 2 Puffs by inhalation every six (6) hours as needed for Wheezing or Shortness of Breath. 1 Inhaler 0   ??? divalproex DR (DEPAKOTE) 250 mg tablet Take 1 Tab by mouth three (3) times daily. 90 Tab 0   ??? sitaGLIPtin (JANUVIA) 100 mg tablet Take 1 Tab by mouth daily. 30 Tab 3   ??? metFORMIN (GLUCOPHAGE) 500 mg tablet Take 1 Tab by mouth two (2) times daily (with meals). 60 Tab 3   ??? tiotropium (SPIRIVA WITH HANDIHALER) 18 mcg inhalation capsule Take 1 Cap by inhalation daily.     ??? triamcinolone acetonide (KENALOG) 0.1 % ointment Apply  to affected area two (2) times a day. 453.6 g 0   ??? rivaroxaban (XARELTO) 20 mg tab tablet Take 1 Tab by mouth daily (with breakfast). 30 Tab 11     No current facility-administered medications on file prior to visit.        PHYSICAL EXAM  Visit Vitals   ??? BP 107/70 (BP 1 Location: Left arm)   ??? Pulse 98   ??? Ht 6' (1.829 m)   ??? Wt 236 lb 9.6 oz (107.3 kg)   ??? BMI 32.09 kg/m2       General appearance - alert, well appearing, and in no distress  Chest--clear.  No crackles or wheezing   [facility]--heart sounds normal 1 and 2.  No murmurs  ASSESSMENT and PLAN    Orders Placed This Encounter   ??? HEPATIC FUNCTION PANEL   ??? VALPROIC ACID, FREE   ??? QUEtiapine (SEROQUEL) 50 mg tablet     lab results and schedule of future lab studies reviewed with patient  reviewed diet, exercise and weight control  reviewed medications and side effects in detail  use of aspirin to prevent MI and TIA's discussed  ?? DM 2--HbA1c has improved at 7.4.  Continue medication  ?? Bipolar disorder--follow-up mental health.  Check Depakote level  ?? COPD--continue medications and follow-up pulmonology  ?? Elevated transaminase--we will recheck levels today.  Will refer to GI.  Will also need a colonoscopy.  Prefers to see Dr. Leonor Liv  ?? Follow up 4 weeks    Darlys Gales MD    Elements of this note have been dictated using speech recognition software. Although reviewed,  errors of speech recognition may have occurred.

## 2015-05-03 ENCOUNTER — Encounter

## 2015-05-03 LAB — HEPATIC FUNCTION PANEL
ALT (SGPT): 127 IU/L — ABNORMAL HIGH (ref 10–35)
AST (SGOT): 73 IU/L — ABNORMAL HIGH (ref 16–40)
Albumin: 3.6 g/dl (ref 3.2–5.0)
Alk. phosphatase: 94 U/L (ref 31–130)
Bilirubin, direct: 0 mg/dl (ref 0.0–0.4)
Bilirubin, total: 0.2 mg/dl — ABNORMAL LOW (ref 0.4–1.4)
Protein, total: 6.1 g/dl (ref 6.0–8.5)

## 2015-05-03 IMAGING — CT CT HEAD W/O CM
1 series · 16 of 30 positions shown, 20 images · non-contrast
Comparison: None.

CLINICAL DATA: Headache

EXAM:
CT HEAD WITHOUT CONTRAST
TECHNIQUE: Contiguous axial images were obtained from the base of the skull
through the vertex without intravenous contrast.

[Series 2: head 5.0 h30s · axial · 0.40mm/px · z∈[+1287,+1432]mm · 16 of 33 slices shown, 20 images]
[im 2/33  brain]
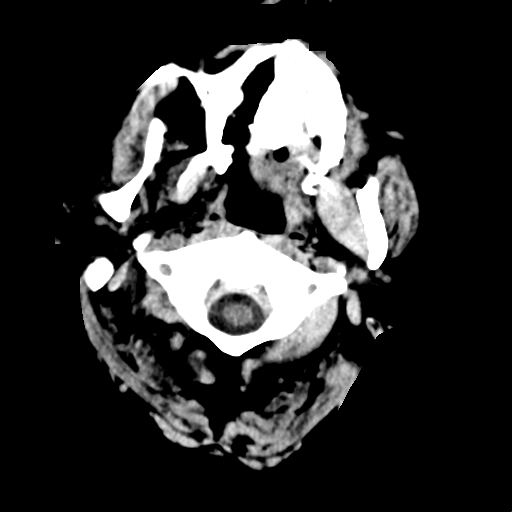
[im 2/33  bone]
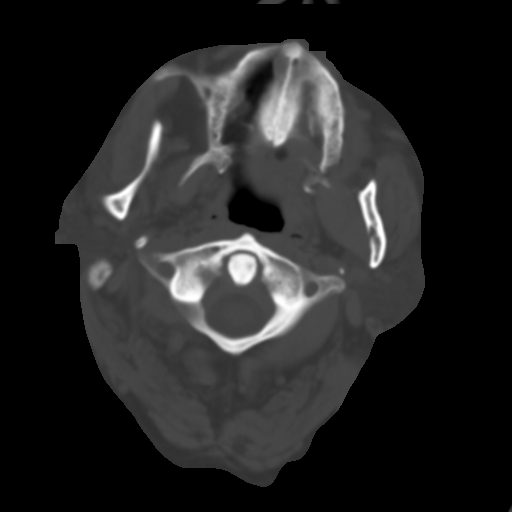
[im 4/33  brain]
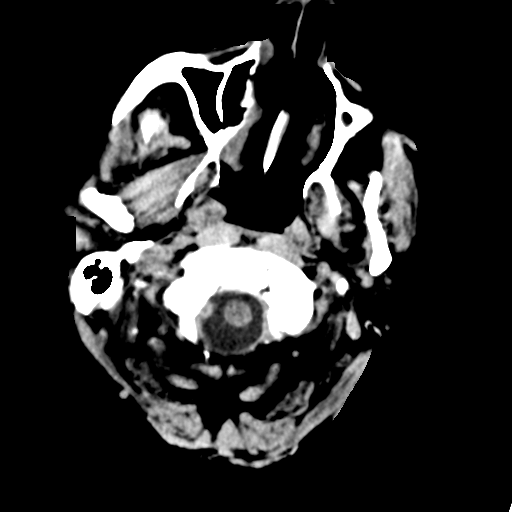
[im 6/33  brain]
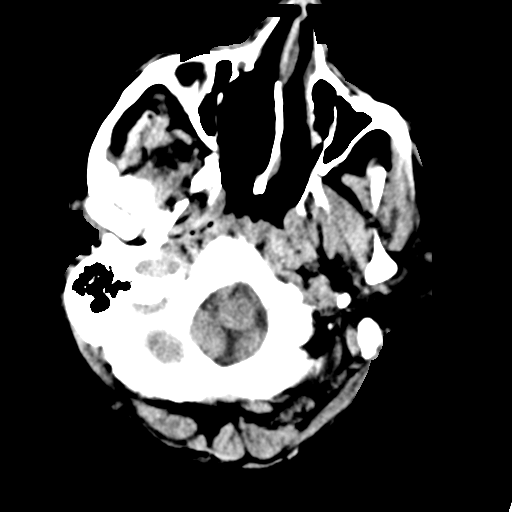
[im 8/33  brain]
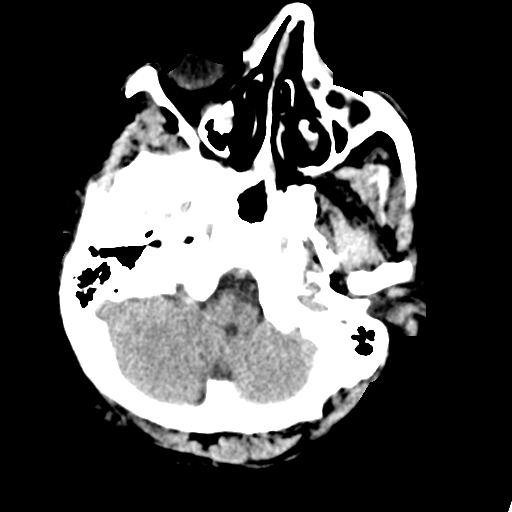
[im 9/33  brain]
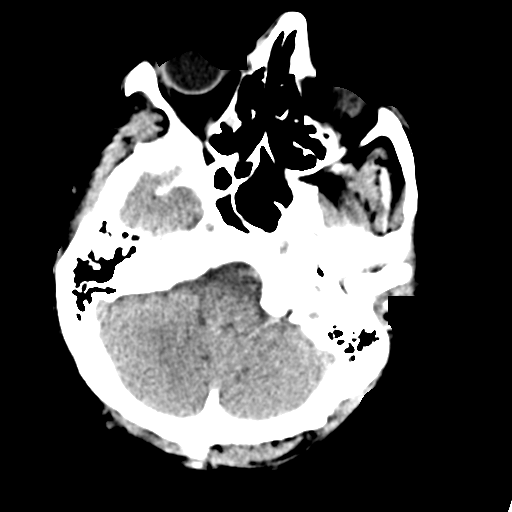
[im 9/33  bone]
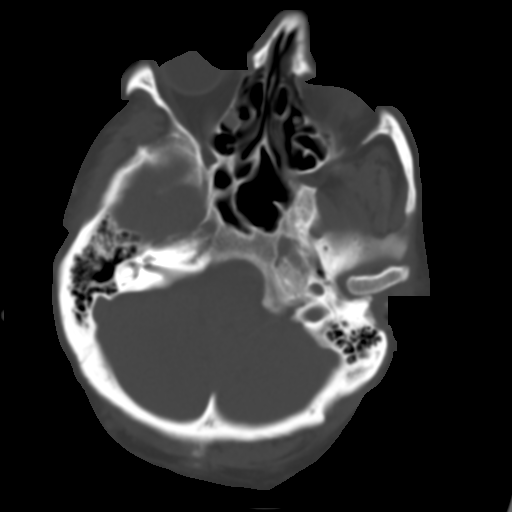
[im 12/33  brain]
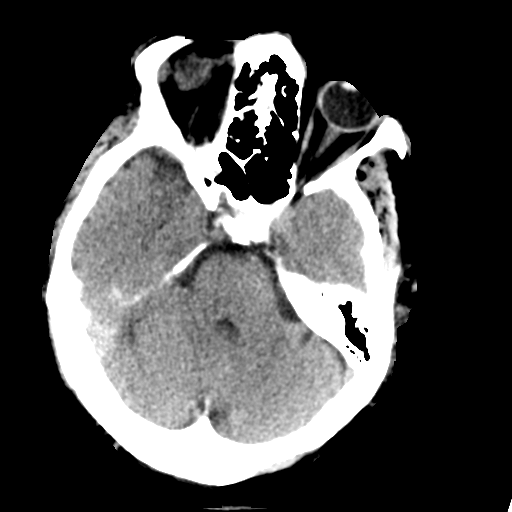
[im 14/33  brain]
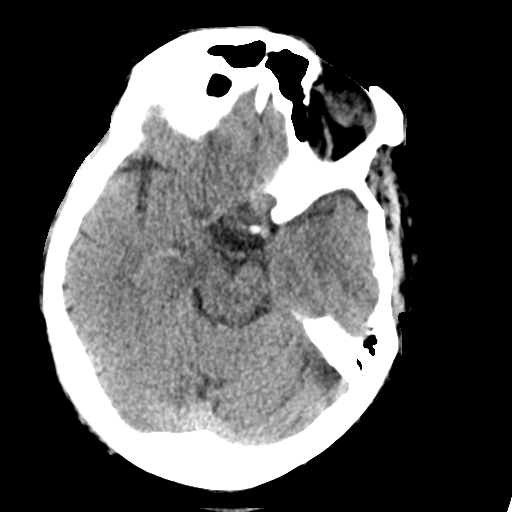
[im 16/33  brain]
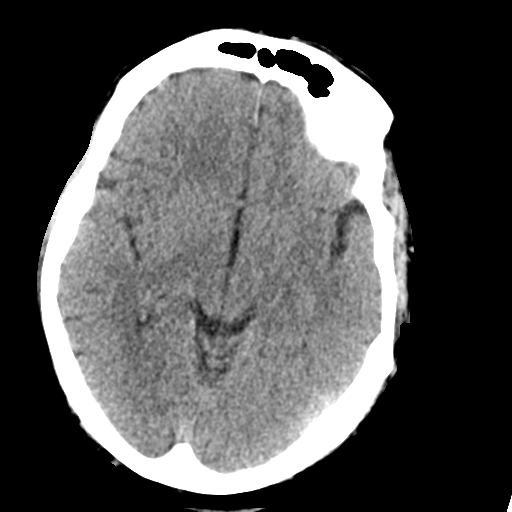
[im 17/33  brain]
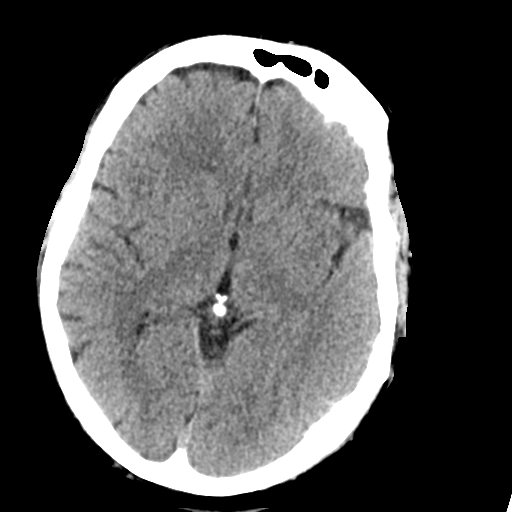
[im 17/33  bone]
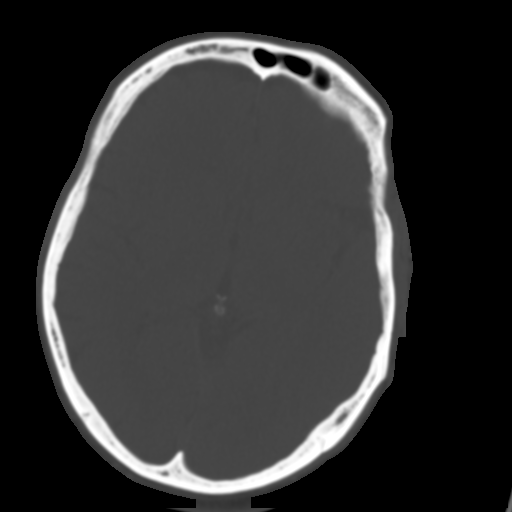
[im 19/33  brain]
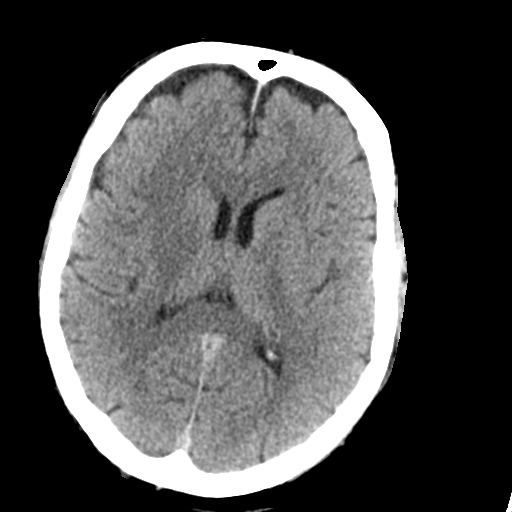
[im 21/33  brain]
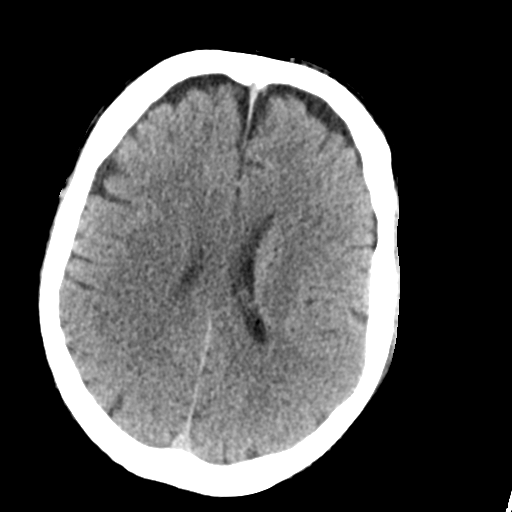
[im 24/33  brain]
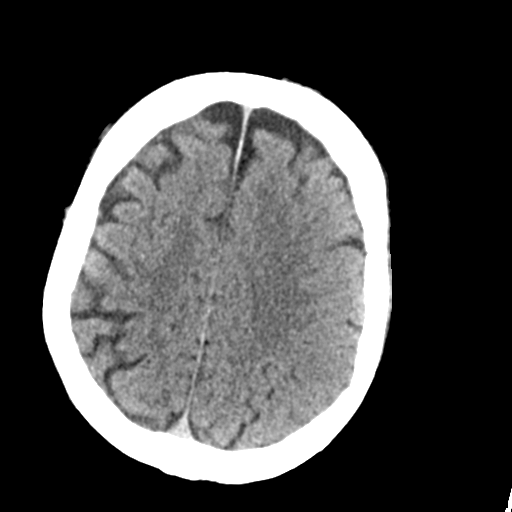
[im 25/33  brain]
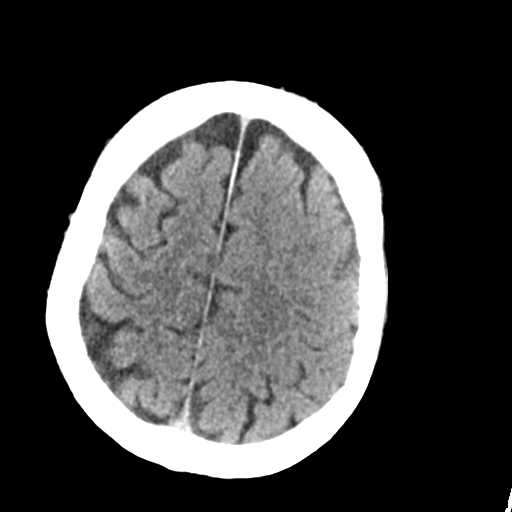
[im 25/33  bone]
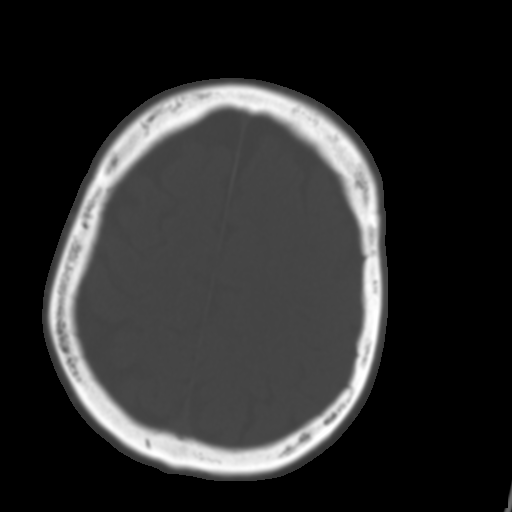
[im 27/33  brain]
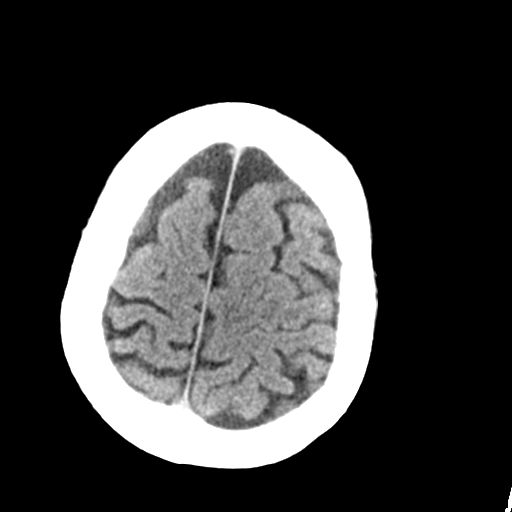
[im 29/33  brain]
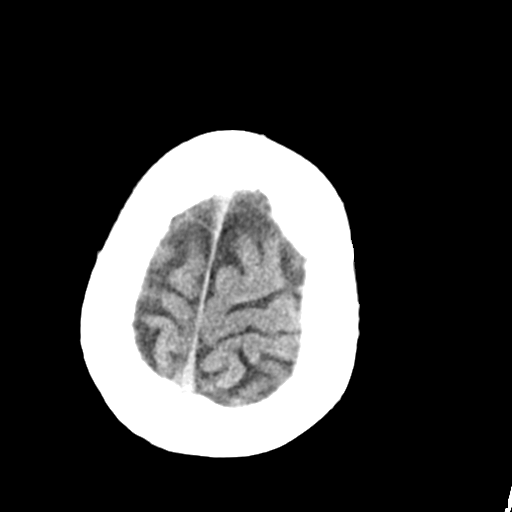
[im 31/33  brain]
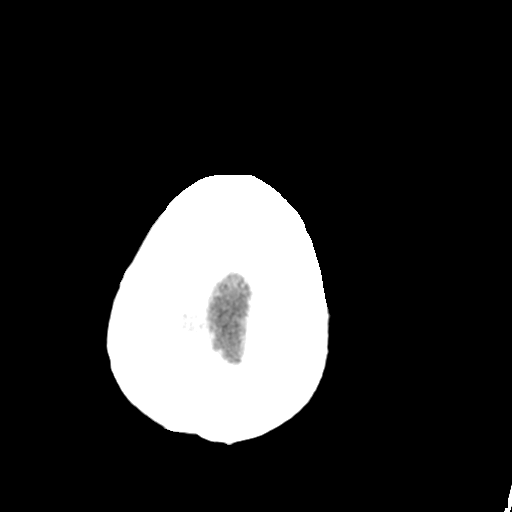

[16 of 30 positions shown; findings below may reference images not displayed]

FINDINGS: Skull and Sinuses:No fracture or destructive process.

Scattered mucosal thickening in the paranasal sinuses and nasal
cavity. No sinus effusion.

Orbits: Right cataract resection.  No acute findings.

Brain: No evidence of acute infarction, hemorrhage, hydrocephalus,
or mass lesion/mass effect. Mild generalized cerebral volume loss.
IMPRESSION: 1. No intracranial findings to explain headache.
2. Chronic sinusitis.

## 2015-05-03 NOTE — Progress Notes (Signed)
Referred to GI

## 2015-05-04 LAB — VALPROIC ACID, FREE: FREE VALPROIC ACID (DEPAKOTE): 3.2 ug/mL — ABNORMAL LOW (ref 6.0–22.0)

## 2015-05-23 ENCOUNTER — Ambulatory Visit: Admit: 2015-05-23 | Discharge: 2015-05-24 | Payer: MEDICARE | Attending: Acute Care | Primary: Family Medicine

## 2015-05-23 DIAGNOSIS — J449 Chronic obstructive pulmonary disease, unspecified: Secondary | ICD-10-CM

## 2015-05-23 LAB — AMB POC SPIROMETRY

## 2015-05-23 MED ORDER — TIOTROPIUM BROMIDE 18 MCG CAPS WITH INHALATION DEVICE
18 mcg | ORAL_CAPSULE | Freq: Every day | RESPIRATORY_TRACT | 6 refills | Status: DC
Start: 2015-05-23 — End: 2016-01-09

## 2015-05-23 MED ORDER — ALBUTEROL SULFATE HFA 90 MCG/ACTUATION AEROSOL INHALER
90 mcg/actuation | Freq: Four times a day (QID) | RESPIRATORY_TRACT | 11 refills | Status: DC | PRN
Start: 2015-05-23 — End: 2016-08-26

## 2015-05-23 NOTE — Progress Notes (Signed)
[facility]  [name]  [place], [place] [address]  [phone]    Patient Name:  Patrick Paul    Date of Birth:  October 30, 1950    Office Visit 05/23/2015      CHIEF COMPLAINT:      Chief Complaint   Patient presents with   ??? Follow-up   ??? COPD       HISTORY OF PRESENT ILLNESS:      Since his last visit, he reports overall stability in his respiratory status.  Exertional dyspnea is at baseline.  Currently denies any significant cough or wheezing.  He is compliant with Spiriva.  Uses albuterol inhaler intermittently with good response.  He has decreased tobacco consumption to one half pack per day and is trying to quit.    Past Medical History   Diagnosis Date   ??? Allergic rhinitis    ??? Asthma    ??? Cancer (HCC)      skin   ??? COPD      emphysema   ??? Diabetes (HCC)      NIDDM   ??? HLD (hyperlipidemia)    ??? Hypertension    ??? Obesity due to excess calories 02/05/2015   ??? Other ill-defined conditions(799.89)      DVT ble/ lue   ??? Psychiatric disorder      bipolar   ??? Seizure (HCC)    ??? Skin cancer      nose   ??? Sleep disorder    ??? Thromboembolus (HCC)      subclavian vein in 2007 + lower limb       Problem List  Date Reviewed: 05/10/2015          Codes Class Noted    Recurrent pulmonary emboli (HCC) ICD-10-CM: I26.99  ICD-9-CM: 415.19  05/23/2015        Seizure (HCC) ICD-10-CM: R56.9  ICD-9-CM: 780.39  Unknown        HLD (hyperlipidemia) ICD-10-CM: E78.5  ICD-9-CM: 272.4  Unknown        Allergic rhinitis ICD-10-CM: J30.9  ICD-9-CM: 477.9  Unknown        Mixed hyperlipidemia ICD-10-CM: E78.2  ICD-9-CM: 272.2  04/04/2015        Chronic obstructive pulmonary disease (HCC) ICD-10-CM: J44.9  ICD-9-CM: 496  03/23/2015        Bipolar 1 disorder (HCC) (Chronic) ICD-10-CM: F31.9  ICD-9-CM: 296.7  02/07/2015        Deep vein thrombosis (DVT) of right lower extremity (HCC) ICD-10-CM: I82.401  ICD-9-CM: 453.40  02/05/2015        Acute pulmonary embolism (HCC) ICD-10-CM: I26.99  ICD-9-CM: 415.19  02/05/2015         Tobacco abuse (Chronic) ICD-10-CM: Z72.0  ICD-9-CM: 305.1  02/05/2015        Obesity due to excess calories (Chronic) ICD-10-CM: E66.09  ICD-9-CM: 278.00  02/05/2015        COPD exacerbation (HCC) ICD-10-CM: J44.1  ICD-9-CM: 491.21  02/05/2015        Hypertension (Chronic) ICD-10-CM: I10  ICD-9-CM: 401.9  02/05/2015              Past Surgical History   Procedure Laterality Date   ??? Pr cardiac surg procedure unlist       heart cath 2007   ??? Hx other surgical       abdominal obstruction in 2012   ??? Hx cataract removal Left 2012       DIAGNOSTICS:  ??  Left lower extremity ultrasound 01/2015???negative for DVT.  Chest CT 02/04/2015??? pulmonary emboli and right lower lobe arteries, no evidence of heart strain or cardiomegaly.  Spirometry 05/23/2015??? moderate restrictive defect.    Social History     Social History   ??? Marital status: DIVORCED     Spouse name: N/A   ??? Number of children: N/A   ??? Years of education: N/A     Social History Main Topics   ??? Smoking status: Current Every Day Smoker     Packs/day: 1.00     Years: 55.00     Types: Cigarettes   ??? Smokeless tobacco: Never Used      Comment: up to 2 - 3 ppd   ??? Alcohol use 0.0 oz/week     0 Standard drinks or equivalent per week   ??? Drug use: Yes     Special: Marijuana   ??? Sexual activity: Not Currently     Other Topics Concern   ??? None     Social History Narrative       Family History   Problem Relation Age of Onset   ??? Heart Disease Mother    ??? Diabetes Mother    ??? Cancer Father    ??? Hypertension Sister    ??? Thyroid Disease Sister    ??? Lung Disease Neg Hx        Allergies   Allergen Reactions   ??? Latex, Natural Rubber Unknown (comments)   ??? Other Food Other (comments)     Beets cause nausea vomiting and myalgias   ??? Grapefruit Rash   ??? Haldol [Haloperidol Lactate] Other (comments)     "maks me feel woozy, shuffle my feet in place"       Current Outpatient Prescriptions   Medication Sig   ??? QUEtiapine (SEROQUEL) 50 mg tablet Take 200 mg by mouth nightly.    ??? fenofibrate nanocrystallized (TRICOR) 145 mg tablet Take 1 Tab by mouth daily.   ??? lisinopril (PRINIVIL, ZESTRIL) 20 mg tablet Take 1 Tab by mouth daily.   ??? albuterol (PROVENTIL HFA, VENTOLIN HFA, PROAIR HFA) 90 mcg/actuation inhaler Take 2 Puffs by inhalation every six (6) hours as needed for Wheezing or Shortness of Breath.   ??? divalproex DR (DEPAKOTE) 250 mg tablet Take 1 Tab by mouth three (3) times daily.   ??? sitaGLIPtin (JANUVIA) 100 mg tablet Take 1 Tab by mouth daily.   ??? metFORMIN (GLUCOPHAGE) 500 mg tablet Take 1 Tab by mouth two (2) times daily (with meals).   ??? tiotropium (SPIRIVA WITH HANDIHALER) 18 mcg inhalation capsule Take 1 Cap by inhalation daily.   ??? rivaroxaban (XARELTO) 20 mg tab tablet Take 1 Tab by mouth daily (with breakfast).     No current facility-administered medications for this visit.        REVIEW OF SYSTEMS:    CONSTITUTIONAL:  There is no history of fever, chills, night sweats, weight loss, weight gain, persistent fatigue, or lethargy/hypersomnolence.    CARDIAC:   No chest pain, pressure, discomfort, palpitations, orthopnea, murmurs, or edema.    GI:  No dysphagia, heartburn reflux, nausea/vomiting, diarrhea, abdominal pain, or bleeding.    NEURO:   There is no history of AMS, persistent headache, decreased level of consciousness, seizures, or motor or sensory deficits.    + recent abnormal LFTs, lipid profile, undergoing workup.    PHYSICAL EXAM:    Visit Vitals   ??? BP 109/66   ??? Pulse (!) 113   ???  Temp 96.2 ??F (35.7 ??C) (Oral)   ??? Resp 16   ??? Ht 5' 9.96" (1.777 m)   ??? Wt 235 lb (106.6 kg)   ??? SpO2 98%   ??? BMI 33.76 kg/m2        GENERAL APPEARANCE:  The patient is  obese and in no respiratory distress.    HEENT:  PERRL.  Conjunctivae unremarkable.    NECK/LYMPHATIC:   Symmetrical with no elevation of jugular venous pulsation.  Trachea midline. No thyroid enlargement.  No cervical adenopathy.    LUNGS:   Normal respiratory effort with symmetrical lung expansion.   Breath sounds    ???Decreased but clear.  HEART:   There is a regular rate and rhythm.  No murmur, rub, or gallop.  There is no edema in the lower extremities.    ABDOMEN:  Soft and non-tender.  No hepatosplenomegaly.  Bowel sounds are normal.      NEURO:  The patient is alert and oriented to person, place, and time.  Memory appears intact and mood is normal.  No gross sensorimotor deficits are present.    DIAGNOSTIC TESTS: Studies were personally reviewed by me and discussed with the patient.    Spirometry:           Chest CT 02/04/2015 - There are pulmonary emboli within the right lower lobe pulmonary arteries. No evidence of prior heart strain. The heart is not enlarged. No pericardial effusion. There is mild dependent subsegmental atelectasis. There is mild centrilobular emphysema. No focal consolidation,pleural effusion or pneumothorax. No pulmonary edema.    ASSESSMENT:  (Medical Decision Making)         Encounter Diagnoses   Name Primary?   ??? Chronic obstructive pulmonary disease, unspecified COPD type (HCC) Yes   ??? Recurrent pulmonary emboli (HCC)    ??? Cigarette nicotine dependence with other nicotine-induced disorder    ??? Encounter for immunization    ??? DOE (dyspnea on exertion)    ??? Restrictive ventilatory defect      He is stable symptomatically, compliant with Spiriva.  Uses albuterol intermittently with good response.  Today's spirometry demonstrates moderate restrictive defect, likely secondary to abdominal obesity.  However, complete pulmonary function tests are indicated.    Compliant with Xarelto.    Needs flu vaccine.  Will needs prevnar 13 vaccine Jan 2017.    PLAN:    High dose flu vaccine was administered.  Continue Spiriva 1 inhalation daily, may use albuterol 4 times daily if needed.  Prescription for Prevnar 13 vaccine to obtain in January 2017 is provided.  Follow-up in 6 months with Dr. Suzie Portela or me, complete pulmonary function test will be obtained prior to that appointment for further evaluation and  restrictive impairment.     Orders Placed This Encounter   ??? AMB POC SPIROMETRY   ??? Influenza virus vaccine (FLUZONE HIGH-DOSE) 65 years and older   ??? ADMIN PNEUMOCOCCAL VACCINE G0009 Medicare Injection Admin Charge   ??? albuterol (PROVENTIL HFA, VENTOLIN HFA, PROAIR HFA) 90 mcg/actuation inhaler   ??? tiotropium (SPIRIVA WITH HANDIHALER) 18 mcg inhalation capsule        Eliseo Gum, NP    Total  time spent with patient -25  min.   Over 50% of today's office visit was spent in face to face time reviewing test results, prognosis, importance of compliance, education about disease process, benefits of medications, instructions for management of acute symptoms, and follow up plans.     Supervising MD: Dr. Criselda Peaches  Electronically signed. Dictated using voice recognition software.  Proof read but unrecognized errors may exist.

## 2015-05-31 ENCOUNTER — Ambulatory Visit: Admit: 2015-05-31 | Discharge: 2015-05-31 | Payer: MEDICARE | Attending: Family Medicine | Primary: Family Medicine

## 2015-05-31 DIAGNOSIS — R52 Pain, unspecified: Secondary | ICD-10-CM

## 2015-05-31 LAB — RAPID INFLUENZA TEST: Rapid Flu test: 0

## 2015-05-31 NOTE — Progress Notes (Signed)
[facility]  Darlys Galesaana Kimimila Tauzin, M.D.  Family Medicine  [address]  [place], [place] [id number]  Office : [phone]  Fax : [phone]        Chief Complaint   Patient presents with   ??? Generalized Body Aches         HISTORY OF PRESENT ILLNESS:     Patrick Paul presents for evaluation of above problem  Recently received a flu vaccine at his pulmonologist.  Now complains of some cough and an ache.  No fever or chills.  O2 sat normal at 96%.  Saw GI yesterday for his elevated transaminase.  Ultrasound of his liver ordered and he has follow-up appointment.  Also seen by mental health and started on Seroquel.  Overall his condition seems to be improving    ROS:     Gastrointestinal ROS: no abdominal pain, change in bowel habits, or black or bloody stools    Current Outpatient Prescriptions on File Prior to Visit   Medication Sig Dispense Refill   ??? albuterol (PROVENTIL HFA, VENTOLIN HFA, PROAIR HFA) 90 mcg/actuation inhaler Take 2 Puffs by inhalation every six (6) hours as needed for Wheezing or Shortness of Breath. 1 Inhaler 11   ??? tiotropium (SPIRIVA WITH HANDIHALER) 18 mcg inhalation capsule Take 1 Cap by inhalation daily. 30 Cap 6   ??? QUEtiapine (SEROQUEL) 50 mg tablet Take 200 mg by mouth nightly.     ??? fenofibrate nanocrystallized (TRICOR) 145 mg tablet Take 1 Tab by mouth daily. 90 Tab 3   ??? lisinopril (PRINIVIL, ZESTRIL) 20 mg tablet Take 1 Tab by mouth daily. 31 Tab 1   ??? divalproex DR (DEPAKOTE) 250 mg tablet Take 1 Tab by mouth three (3) times daily. 90 Tab 0   ??? sitaGLIPtin (JANUVIA) 100 mg tablet Take 1 Tab by mouth daily. 30 Tab 3   ??? metFORMIN (GLUCOPHAGE) 500 mg tablet Take 1 Tab by mouth two (2) times daily (with meals). 60 Tab 3   ??? rivaroxaban (XARELTO) 20 mg tab tablet Take 1 Tab by mouth daily (with breakfast). 30 Tab 11     No current facility-administered medications on file prior to visit.        PHYSICAL EXAM  Visit Vitals   ??? BP 120/79 (BP 1 Location: Left arm)   ??? Pulse 93    ??? Temp 97.9 ??F (36.6 ??C)   ??? Ht 5' 9.96" (1.777 m)   ??? Wt 231 lb 3.2 oz (104.9 kg)   ??? SpO2 96%  Comment: room air   ??? BMI 33.21 kg/m2       Chest - clear to auscultation, no wheezes, rales or rhonchi, symmetric air entry  Heart - normal rate, regular rhythm, normal S1, S2, no murmurs, rubs, clicks or gallops    ASSESSMENT and PLAN    Orders Placed This Encounter   ??? Rapid Influenza Test   ??? METABOLIC PANEL, BASIC   ??? HEMOGLOBIN A1C W/O EAG   ??? LIPID PANEL     lab results and schedule of future lab studies reviewed with patient  reviewed diet, exercise and weight control  cardiovascular risk and specific lipid/LDL goals reviewed  reviewed medications and side effects in detail  Respiratory tract infection--appears viral.  Flu test negative.  Continue with Mucinex.  No need for antibiotic.  Elevated transaminase--follow-up GI.  Bipolar disorder--follow-up mental health  COPD--follow-up with neurology  Follow up 3  months with labs    Darlys Galesaana Yamile Roedl MD  Elements of this note have been dictated using speech recognition software. Although reviewed, errors of speech recognition may have occurred.

## 2015-06-18 ENCOUNTER — Emergency Department: Admit: 2015-06-19 | Payer: MEDICARE | Primary: Family Medicine

## 2015-06-18 ENCOUNTER — Inpatient Hospital Stay
Admit: 2015-06-18 | Discharge: 2015-06-19 | Disposition: A | Discharge status: Home / Self Care | Payer: MEDICARE | Attending: Emergency Medicine

## 2015-06-18 DIAGNOSIS — G4489 Other headache syndrome: Secondary | ICD-10-CM

## 2015-06-18 LAB — CBC WITH AUTOMATED DIFF
ABS. BASOPHILS: 0.1 10*3/uL (ref 0.0–0.2)
ABS. EOSINOPHILS: 0.2 10*3/uL (ref 0.0–0.8)
ABS. IMM. GRANS.: 0 10*3/uL (ref 0.0–0.5)
ABS. LYMPHOCYTES: 1.8 10*3/uL (ref 0.5–4.6)
ABS. MONOCYTES: 0.5 10*3/uL (ref 0.1–1.3)
ABS. NEUTROPHILS: 3.9 10*3/uL (ref 1.7–8.2)
BASOPHILS: 1 % (ref 0.0–2.0)
EOSINOPHILS: 3 % (ref 0.5–7.8)
HCT: 37.1 % — ABNORMAL LOW (ref 41.1–50.3)
HGB: 13.4 g/dL — ABNORMAL LOW (ref 13.6–17.2)
IMMATURE GRANULOCYTES: 0.3 % (ref 0.0–5.0)
LYMPHOCYTES: 28 % (ref 13–44)
MCH: 31.8 PG (ref 26.1–32.9)
MCHC: 36.1 g/dL — ABNORMAL HIGH (ref 31.4–35.0)
MCV: 88.1 FL (ref 79.6–97.8)
MONOCYTES: 8 % (ref 4.0–12.0)
MPV: 9.9 FL — ABNORMAL LOW (ref 10.8–14.1)
NEUTROPHILS: 60 % (ref 43–78)
PLATELET: 206 10*3/uL (ref 150–450)
RBC: 4.21 M/uL — ABNORMAL LOW (ref 4.23–5.67)
RDW: 13.2 % (ref 11.9–14.6)
WBC: 6.5 10*3/uL (ref 4.3–11.1)

## 2015-06-18 NOTE — ED Notes (Signed)
I have reviewed discharge instructions with the patient.  The patient verbalized understanding. [facility]

## 2015-06-18 NOTE — ED Triage Notes (Signed)
[facility] for [facility]. VSS for EMS. Patient did not attempt tx at pta. Patient also reports nausea. States he has not had his BP medication in 4 days.

## 2015-06-18 NOTE — ED Provider Notes (Signed)
HPI Comments: Patient states he has had a headache all day today. Nausea with no vomiting. Chronic nonproductive cough unchanged. Smokes. Does not want or intend to quit. States he took a Neurontin 800 mg today. States he usually lives with his sister. She had him removed from the home on Friday. He is now staying at the [facility]. States all of his medications are at his sisters house. He is supposed to be on Xarelto for a PE. He was seen by his PCP Dr. Fredrik Rigger 2 weeks ago. He is a patient at [facility].     Patient is a 64 y.o. male presenting with headaches. The history is provided by the patient and medical records.   Headache    This is a new problem. The current episode started 6 to 12 hours ago. The problem occurs constantly. The problem has not changed since onset.The headache is aggravated by nothing. The pain is located in the generalized region. The quality of the pain is described as dull. The pain is mild. Associated symptoms include nausea. Pertinent negatives include no fever, no shortness of breath and no vomiting. He has tried nothing for the symptoms.        Past Medical History:   Diagnosis Date   ??? Allergic rhinitis    ??? Asthma    ??? Cancer (HCC)      skin   ??? COPD      emphysema   ??? Diabetes (HCC)      NIDDM   ??? Diabetes (HCC)    ??? HLD (hyperlipidemia)    ??? Hypertension    ??? Obesity due to excess calories 02/05/2015   ??? Other ill-defined conditions(799.89)      DVT ble/ lue   ??? Psychiatric disorder      bipolar   ??? Seizure (HCC)    ??? Skin cancer      nose   ??? Sleep disorder    ??? 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 Pr cardiac surg procedure unlist       heart cath 2007   ??? Hx other surgical       abdominal obstruction in 2012   ??? Hx cataract removal Left 2012         Family History:   Problem Relation Age of Onset   ??? Heart Disease Mother    ??? Diabetes Mother    ??? Cancer Father     ??? Hypertension Sister    ??? Thyroid Disease Sister    ??? Lung Disease Neg Hx        Social History     Social History   ??? Marital status: DIVORCED     Spouse name: N/A   ??? Number of children: N/A   ??? Years of education: N/A     Occupational History   ??? Not on file.     Social History Main Topics   ??? Smoking status: Current Every Day Smoker     Packs/day: 1.00     Years: 55.00     Types: Cigarettes   ??? Smokeless tobacco: Never Used      Comment: up to 2 - 3 ppd   ??? Alcohol use 0.0 oz/week     0 Standard drinks or equivalent per week   ??? Drug use: Yes     Special: Marijuana   ???  Sexual activity: Not Currently     Other Topics Concern   ??? Not on file     Social History Narrative         ALLERGIES: Latex, natural rubber; Other food; Grapefruit; and Haldol [haloperidol lactate]    Review of Systems   Constitutional: Negative.  Negative for fever.   HENT: Negative for rhinorrhea and sore throat.    Eyes: Negative.    Respiratory: Positive for cough. Negative for shortness of breath.    Cardiovascular: Negative.    Gastrointestinal: Positive for nausea. Negative for abdominal pain, diarrhea and vomiting.   Genitourinary: Negative.    Musculoskeletal: Negative.    Skin: Negative.    Neurological: Positive for numbness and headaches.   Hematological:        On Xarelto   Psychiatric/Behavioral:        Long standing mental health disorder       Vitals:    06/18/15 2130 06/18/15 2145 06/18/15 2159 06/18/15 2200   BP: 146/68 149/73  148/67   Pulse: 88 87 88 85   Resp:  21 25 24    Temp:    98 ??F (36.7 ??C)   SpO2: 94% (!) 89% 90% 92%   Weight:       Height:                Physical Exam   Constitutional: He is oriented to person, place, and time. He appears well-developed and well-nourished.   HENT:   Head: Normocephalic and atraumatic.   Right Ear: External ear normal.   Left Ear: External ear normal.   Mouth/Throat: Oropharynx is clear and moist.   edentulous   Eyes: Conjunctivae and EOM are normal. Pupils are equal, round, and  reactive to light. No scleral icterus.   Neck: Normal range of motion. Neck supple. No JVD present.   Cardiovascular: Normal rate, regular rhythm, normal heart sounds and intact distal pulses.    Pulmonary/Chest: Effort normal. He has wheezes. He has no rales.   Abdominal: Soft. Bowel sounds are normal. He exhibits no mass. There is no tenderness.   Musculoskeletal: Normal range of motion. He exhibits no edema or tenderness.   Neurological: He is alert and oriented to person, place, and time.   Skin: Skin is warm and dry.   Psychiatric: He has a normal mood and affect. His behavior is normal.   Nursing note and vitals reviewed.       MDM  ED Course       Procedures    Headache - Tylenol 1000 mg  Nausea - Zofran 4 mg IV  Given his Xarelto and Lisinopril as he requested  CT head - no bleed  Labs reviewed  Wheezing - Duoneb given - improved  Solumedrol 125 mg IV  Results and instructions discussed with patient  Encouraged to retrieve his medications and get filled any that he does not have.  Follow up with PCP, Pulmonary, Mental Health

## 2015-06-18 NOTE — ED Notes (Signed)
Patient given Mal[place]key sandwich and milk per patient request

## 2015-06-19 LAB — METABOLIC PANEL, COMPREHENSIVE
A-G Ratio: 1 — ABNORMAL LOW (ref 1.2–3.5)
ALT (SGPT): 119 U/L — ABNORMAL HIGH (ref 12–65)
AST (SGOT): 74 U/L — ABNORMAL HIGH (ref 15–37)
Albumin: 3.4 g/dL (ref 3.2–4.6)
Alk. phosphatase: 130 U/L (ref 50–136)
Anion gap: 9 mmol/L (ref 7–16)
BUN: 5 MG/DL — ABNORMAL LOW (ref 8–23)
Bilirubin, total: 0.5 MG/DL (ref 0.2–1.1)
CO2: 21 mmol/L (ref 21–32)
Calcium: 7.9 MG/DL — ABNORMAL LOW (ref 8.3–10.4)
Chloride: 102 mmol/L (ref 98–107)
Creatinine: 1.25 MG/DL (ref 0.8–1.5)
GFR est AA: >60 mL/min/{1.73_m2} (ref >60)
GFR est non-AA: >60 mL/min/{1.73_m2} (ref >60)
Globulin: 3.5 g/dL (ref 2.3–3.5)
Glucose: 218 mg/dL — ABNORMAL HIGH (ref 65–100)
Potassium: 3.4 mmol/L — ABNORMAL LOW (ref 3.5–5.1)
Protein, total: 6.9 g/dL (ref 6.3–8.2)
Sodium: 132 mmol/L — ABNORMAL LOW (ref 136–145)

## 2015-06-19 MED ORDER — ACETAMINOPHEN 500 MG TAB
500 mg | ORAL | Status: AC
Start: 2015-06-19 — End: 2015-06-18
  Administered 2015-06-19: 01:00:00 via ORAL

## 2015-06-19 MED ORDER — IPRATROPIUM-ALBUTEROL 2.5 MG-0.5 MG/3 ML NEB SOLUTION
2.5 mg-0.5 mg/3 ml | RESPIRATORY_TRACT | Status: AC
Start: 2015-06-19 — End: 2015-06-18
  Administered 2015-06-19: 01:00:00 via RESPIRATORY_TRACT

## 2015-06-19 MED ORDER — METHYLPREDNISOLONE (PF) 125 MG/2 ML IJ SOLR
125 mg/2 mL | Freq: Once | INTRAMUSCULAR | Status: AC
Start: 2015-06-19 — End: 2015-06-18
  Administered 2015-06-19: 01:00:00 via INTRAVENOUS

## 2015-06-19 MED ORDER — LISINOPRIL 5 MG TAB
5 mg | Freq: Once | ORAL | Status: AC
Start: 2015-06-19 — End: 2015-06-18
  Administered 2015-06-19: 01:00:00 via ORAL

## 2015-06-19 MED ORDER — ONDANSETRON (PF) 4 MG/2 ML INJECTION
4 mg/2 mL | INTRAMUSCULAR | Status: AC
Start: 2015-06-19 — End: 2015-06-18
  Administered 2015-06-19: 01:00:00 via INTRAVENOUS

## 2015-06-19 MED FILL — LISINOPRIL 5 MG TAB: 5 mg | ORAL | Qty: 4

## 2015-06-19 MED FILL — SOLU-MEDROL (PF) 125 MG/2 ML SOLUTION FOR INJECTION: 125 mg/2 mL | INTRAMUSCULAR | Qty: 2

## 2015-06-19 MED FILL — MAPAP EXTRA STRENGTH 500 MG TABLET: 500 mg | ORAL | Qty: 2

## 2015-06-19 MED FILL — ONDANSETRON (PF) 4 MG/2 ML INJECTION: 4 mg/2 mL | INTRAMUSCULAR | Qty: 2

## 2015-06-19 MED FILL — IPRATROPIUM-ALBUTEROL 2.5 MG-0.5 MG/3 ML NEB SOLUTION: 2.5 mg-0.5 mg/3 ml | RESPIRATORY_TRACT | Qty: 3

## 2015-06-19 NOTE — Progress Notes (Signed)
Seen in er - needs fu appt.this week

## 2015-06-19 NOTE — Progress Notes (Signed)
I called and spoke to patient's sister, Darden PalmerJean Godfrey, who states that her brother, Sherrie MustacheRobert Gibbs, is no longer living with her and is living at the [facility].    She states that if she hears from Mr. Ledell PeoplesDodds, she will give him the message to call our office to schedule an emergency room visit follow. Per Dr. Pasty ArchNaidu's instruction.

## 2015-06-21 ENCOUNTER — Inpatient Hospital Stay
Admit: 2015-06-21 | Discharge: 2015-06-22 | Disposition: A | Discharge status: Home / Self Care | Payer: MEDICARE | Attending: Emergency Medicine

## 2015-06-21 DIAGNOSIS — F319 Bipolar disorder, unspecified: Secondary | ICD-10-CM

## 2015-06-21 NOTE — ED Provider Notes (Addendum)
HPI Comments: Per triage note patient reportedly presented to return complaining of being in a manic phase and being out of his medicines for the past 5 days he was called for room placement several times and did not respond at finally did acknowledge presence in the waiting room placed the room upon my arrival to the room the patient is excessively somnolent he does arouse to painful stimuli and moves all extremities but immediately falls back to sleep and is unable to answer any questions.  He is noted to have a history of pulmonary emboli as well as COPD and bipolar disorder.  Review of chart also indicates patient is currently homeless and has been staying at the [facility] according to family member.    Patient is a 64 y.o. male presenting with mental health disorder. The history is provided by a caregiver and the patient.   Mental Health Problem    This is a new problem. Episode onset: unknown. Associated symptoms include somnolence. Risk factors: COPD; homeless; bipolar.        Past Medical History:   Diagnosis Date   ??? Allergic rhinitis    ??? Asthma    ??? Cancer (HCC)      skin   ??? COPD      emphysema   ??? Diabetes (HCC)      NIDDM   ??? Diabetes (HCC)    ??? HLD (hyperlipidemia)    ??? Hypertension    ??? Obesity due to excess calories 02/05/2015   ??? Other ill-defined conditions(799.89)      DVT ble/ lue   ??? Psychiatric disorder      bipolar   ??? Seizure (HCC)    ??? Skin cancer      nose   ??? Sleep disorder    ??? 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 Pr cardiac surg procedure unlist       heart cath 2007   ??? Hx other surgical       abdominal obstruction in 2012   ??? Hx cataract removal Left 2012         Family History:   Problem Relation Age of Onset   ??? Heart Disease Mother    ??? Diabetes Mother    ??? Cancer Father    ??? Hypertension Sister    ??? Thyroid Disease Sister    ??? Lung Disease Neg Hx        Social History     Social History    ??? Marital status: DIVORCED     Spouse name: N/A   ??? Number of children: N/A   ??? Years of education: N/A     Occupational History   ??? Not on file.     Social History Main Topics   ??? Smoking status: Current Every Day Smoker     Packs/day: 1.00     Years: 55.00     Types: Cigarettes   ??? Smokeless tobacco: Never Used      Comment: up to 2 - 3 ppd   ??? Alcohol use 0.0 oz/week     0 Standard drinks or equivalent per week   ??? Drug use: Yes     Special: Marijuana   ??? Sexual activity: Not Currently     Other Topics Concern   ??? Not on file     Social History Narrative  ALLERGIES: Latex, natural rubber; Other food; Grapefruit; and Haldol [haloperidol lactate]    Review of Systems   Unable to perform ROS: Patient unresponsive       Vitals:    06/21/15 1820   BP: 90/44   Pulse: (!) 106   Resp: 20   Temp: 97.7 ??F (36.5 ??C)   SpO2: 98%   Weight: 112.5 kg (248 lb)   Height: 6' (1.829 m)            Physical Exam   Constitutional: He appears well-developed and well-nourished. No distress.   HENT:   Head: Normocephalic and atraumatic.   Eyes: Conjunctivae and EOM are normal. Pupils are equal, round, and reactive to light.   Neck: Normal range of motion. Neck supple.   Cardiovascular: Normal rate, regular rhythm and normal heart sounds.    Pulmonary/Chest: Effort normal and breath sounds normal.   Abdominal: Soft. Bowel sounds are normal.   Musculoskeletal: Normal range of motion. He exhibits no edema or deformity.   Neurological:   Somnolent, arouses to painful stimuli; snoring   Skin: Skin is warm and dry.   Nursing note and vitals reviewed.       MDM  Number of Diagnoses or Management Options  Diagnosis management comments: Due to patient's inability to give a history at this time due to excessive somnolence and a history of COPD blood gas be obtained to evaluate for possible hypercapnia also history of Depakote use in the ultimate status will obtain an ammonia level to check for encephalopathy.        Amount and/or Complexity of Data Reviewed  Clinical lab tests: ordered and reviewed  Independent visualization of images, tracings, or specimens: yes    Risk of Complications, Morbidity, and/or Mortality  Presenting problems: moderate  Diagnostic procedures: moderate  Management options: moderate    Patient Progress  Patient progress: stable    ED Course       Procedures      RECHECK THIS MORNING THE PATIENT AROUSES TO FULL CONSCIOUSNESS DENIES ANY MANIC SYMPTOMS AT THIS TIME AND STATES HE IS OUT OF HIS jANUVIA HIS LITHIUM AND METFORMIN HE ALSO STATES HE IS HOMELESS LIVING IN THE [place] [facility] AND A SOCIAL WORK CONSULTATION WILL BE OBTAINED to assist with his medications

## 2015-06-21 NOTE — ED Notes (Signed)
Unable to locate patient for room placement

## 2015-06-21 NOTE — ED Triage Notes (Signed)
Reports he got "95% manic today." states has been out of his meds x 5 days.  States he is on lithium.

## 2015-06-21 NOTE — ED Notes (Signed)
Unable to locate for room placement

## 2015-06-22 LAB — CBC WITH AUTOMATED DIFF
ABS. BASOPHILS: 0 10*3/uL (ref 0.0–0.2)
ABS. EOSINOPHILS: 0.2 10*3/uL (ref 0.0–0.8)
ABS. IMM. GRANS.: 0 10*3/uL (ref 0.0–0.5)
ABS. LYMPHOCYTES: 1.5 10*3/uL (ref 0.5–4.6)
ABS. MONOCYTES: 0.5 10*3/uL (ref 0.1–1.3)
ABS. NEUTROPHILS: 4 10*3/uL (ref 1.7–8.2)
BASOPHILS: 0 % (ref 0.0–2.0)
EOSINOPHILS: 3 % (ref 0.5–7.8)
HCT: 33.6 % — ABNORMAL LOW (ref 41.1–50.3)
HGB: 11.8 g/dL — ABNORMAL LOW (ref 13.6–17.2)
IMMATURE GRANULOCYTES: 0.3 % (ref 0.0–5.0)
LYMPHOCYTES: 25 % (ref 13–44)
MCH: 31.5 PG (ref 26.1–32.9)
MCHC: 35.1 g/dL — ABNORMAL HIGH (ref 31.4–35.0)
MCV: 89.6 FL (ref 79.6–97.8)
MONOCYTES: 7 % (ref 4.0–12.0)
MPV: 9.6 FL — ABNORMAL LOW (ref 10.8–14.1)
NEUTROPHILS: 65 % (ref 43–78)
PLATELET: 218 10*3/uL (ref 150–450)
RBC: 3.75 M/uL — ABNORMAL LOW (ref 4.23–5.67)
RDW: 13.5 % (ref 11.9–14.6)
WBC: 6.2 10*3/uL (ref 4.3–11.1)

## 2015-06-22 LAB — DRUG SCREEN, URINE
AMPHETAMINES: NEGATIVE
BARBITURATES: NEGATIVE
BENZODIAZEPINES: NEGATIVE
COCAINE: NEGATIVE
METHADONE: NEGATIVE
OPIATES: NEGATIVE
PCP(PHENCYCLIDINE): NEGATIVE
THC (TH-CANNABINOL): NEGATIVE

## 2015-06-22 LAB — BLOOD GAS, ARTERIAL
Arterial O2 Hgb: 88.8 % — ABNORMAL LOW (ref 94.0–97.0)
BASE DEFICIT: 5.9 mmol/L — ABNORMAL HIGH (ref 0–2)
BICARBONATE: 20 mmol/L — ABNORMAL LOW (ref 22.0–26.0)
CARBOXYHEMOGLOBIN: 1.2 % (ref 0.5–1.5)
DEOXYHEMOGLOBIN: 10 % — ABNORMAL HIGH (ref 0.0–5.0)
FIO2: 21 %
METHEMOGLOBIN: 0.5 % (ref 0.0–1.5)
O2 SAT: 90 % — ABNORMAL LOW (ref 92.0–98.5)
PCO2: 38 mmHg (ref 35.0–45.0)
PO2: 60 mmHg — ABNORMAL LOW (ref 75.0–100.0)
TOTAL HEMOGLOBIN: 12.7 GM/DL (ref 11.7–15.0)
pH: 7.33 — ABNORMAL LOW (ref 7.35–7.45)

## 2015-06-22 LAB — METABOLIC PANEL, COMPREHENSIVE
A-G Ratio: 1.1 — ABNORMAL LOW (ref 1.2–3.5)
ALT (SGPT): 92 U/L — ABNORMAL HIGH (ref 12–65)
AST (SGOT): 53 U/L — ABNORMAL HIGH (ref 15–37)
Albumin: 3.2 g/dL (ref 3.2–4.6)
Alk. phosphatase: 100 U/L (ref 50–136)
Anion gap: 7 mmol/L (ref 7–16)
BUN: 11 MG/DL (ref 8–23)
Bilirubin, total: 0.4 MG/DL (ref 0.2–1.1)
CO2: 24 mmol/L (ref 21–32)
Calcium: 8 MG/DL — ABNORMAL LOW (ref 8.3–10.4)
Chloride: 104 mmol/L (ref 98–107)
Creatinine: 1.98 MG/DL — ABNORMAL HIGH (ref 0.8–1.5)
GFR est AA: 44 mL/min/{1.73_m2} — ABNORMAL LOW (ref >60)
GFR est non-AA: 36 mL/min/{1.73_m2} — ABNORMAL LOW (ref >60)
Globulin: 2.9 g/dL (ref 2.3–3.5)
Glucose: 209 mg/dL — ABNORMAL HIGH (ref 65–100)
Potassium: 3.3 mmol/L — ABNORMAL LOW (ref 3.5–5.1)
Protein, total: 6.1 g/dL — ABNORMAL LOW (ref 6.3–8.2)
Sodium: 135 mmol/L — ABNORMAL LOW (ref 136–145)

## 2015-06-22 LAB — AMMONIA: Ammonia, plasma: 28 umol/L (ref 11–32)

## 2015-06-22 LAB — URINALYSIS W/ RFLX MICROSCOPIC
Bilirubin: NEGATIVE
Blood: NEGATIVE
Glucose: NEGATIVE mg/dL
Ketone: NEGATIVE mg/dL
Leukocyte Esterase: NEGATIVE
Nitrites: NEGATIVE
Protein: NEGATIVE mg/dL
Specific gravity: 1.004 (ref 1.001–1.023)
Urobilinogen: 0.2 EU/dL (ref 0.2–1.0)
pH (UA): 6 (ref 5.0–9.0)

## 2015-06-22 MED ORDER — LITHIUM CARBONATE 300 MG CAP
300 mg | ORAL_CAPSULE | Freq: Three times a day (TID) | ORAL | 0 refills | Status: DC
Start: 2015-06-22 — End: 2016-01-09

## 2015-06-22 MED ORDER — DIVALPROEX 250 MG TAB, DELAYED RELEASE
250 mg | ORAL_TABLET | Freq: Three times a day (TID) | ORAL | 0 refills | Status: DC
Start: 2015-06-22 — End: 2016-01-09

## 2015-06-22 MED ORDER — SITAGLIPTIN 100 MG TAB
100 mg | ORAL_TABLET | Freq: Every day | ORAL | 3 refills | Status: DC
Start: 2015-06-22 — End: 2016-01-09

## 2015-06-22 MED ORDER — METFORMIN 500 MG TAB
500 mg | ORAL_TABLET | Freq: Two times a day (BID) | ORAL | 3 refills | Status: DC
Start: 2015-06-22 — End: 2016-01-09

## 2015-06-22 NOTE — ED Notes (Signed)
Pt resting quietly with eyes closed. No signs of distress noted.

## 2015-06-22 NOTE — ED Notes (Signed)
Discharge instructions and prescriptions provided and explained to the pt.  Opportunity for questions provided.  Instructed to call once ready to leave. SW provided a walker for the patient. SW is providing a cab ride to the pharmacy and to  [facility]. Patient assisted to the waiting room.

## 2015-06-22 NOTE — ED Notes (Signed)
Pt resting with eyes closed, continues on the monitor.

## 2015-06-22 NOTE — ED Notes (Signed)
Patient received a meal tray

## 2015-06-22 NOTE — ED Notes (Addendum)
Patient is resting on stretcher. Patient is on cardiac monitor and cycling vital signs. Patient denies needs at this time. Call light within reach. Side rails x 2. Will continue to monitor. Patient is updated by MD that he will be waiting for SW to evaluate.

## 2015-06-22 NOTE — ED Notes (Signed)
Pt resting with eyes closed. No acute distress noted. Pt remains on the monitor at this time.

## 2015-06-22 NOTE — ED Notes (Signed)
Meal tray ordered for patient

## 2015-06-22 NOTE — ED Notes (Signed)
Went to d/c the patient but patient is in the bathroom

## 2015-06-22 NOTE — ED Notes (Signed)
Pt continues to rest quietly with eyes closed, has rolled himself onto his left side. Monitor on and vitals WNL.

## 2015-06-22 NOTE — ED Notes (Signed)
Patient continues to rest on the stretcher. Patient is on cardiac monitor and cycling vital signs. Patient denies needs at this time. Call light within reach. Side rails x 2 for safety. Will continue to monitor.

## 2015-08-23 IMAGING — CR DG CHEST 2V
2 series · 2 of 2 positions shown · non-contrast
Comparison: 08/17/2014

CLINICAL DATA: Shortness of breath.

EXAM:
CHEST  2 VIEW

[chest pa]
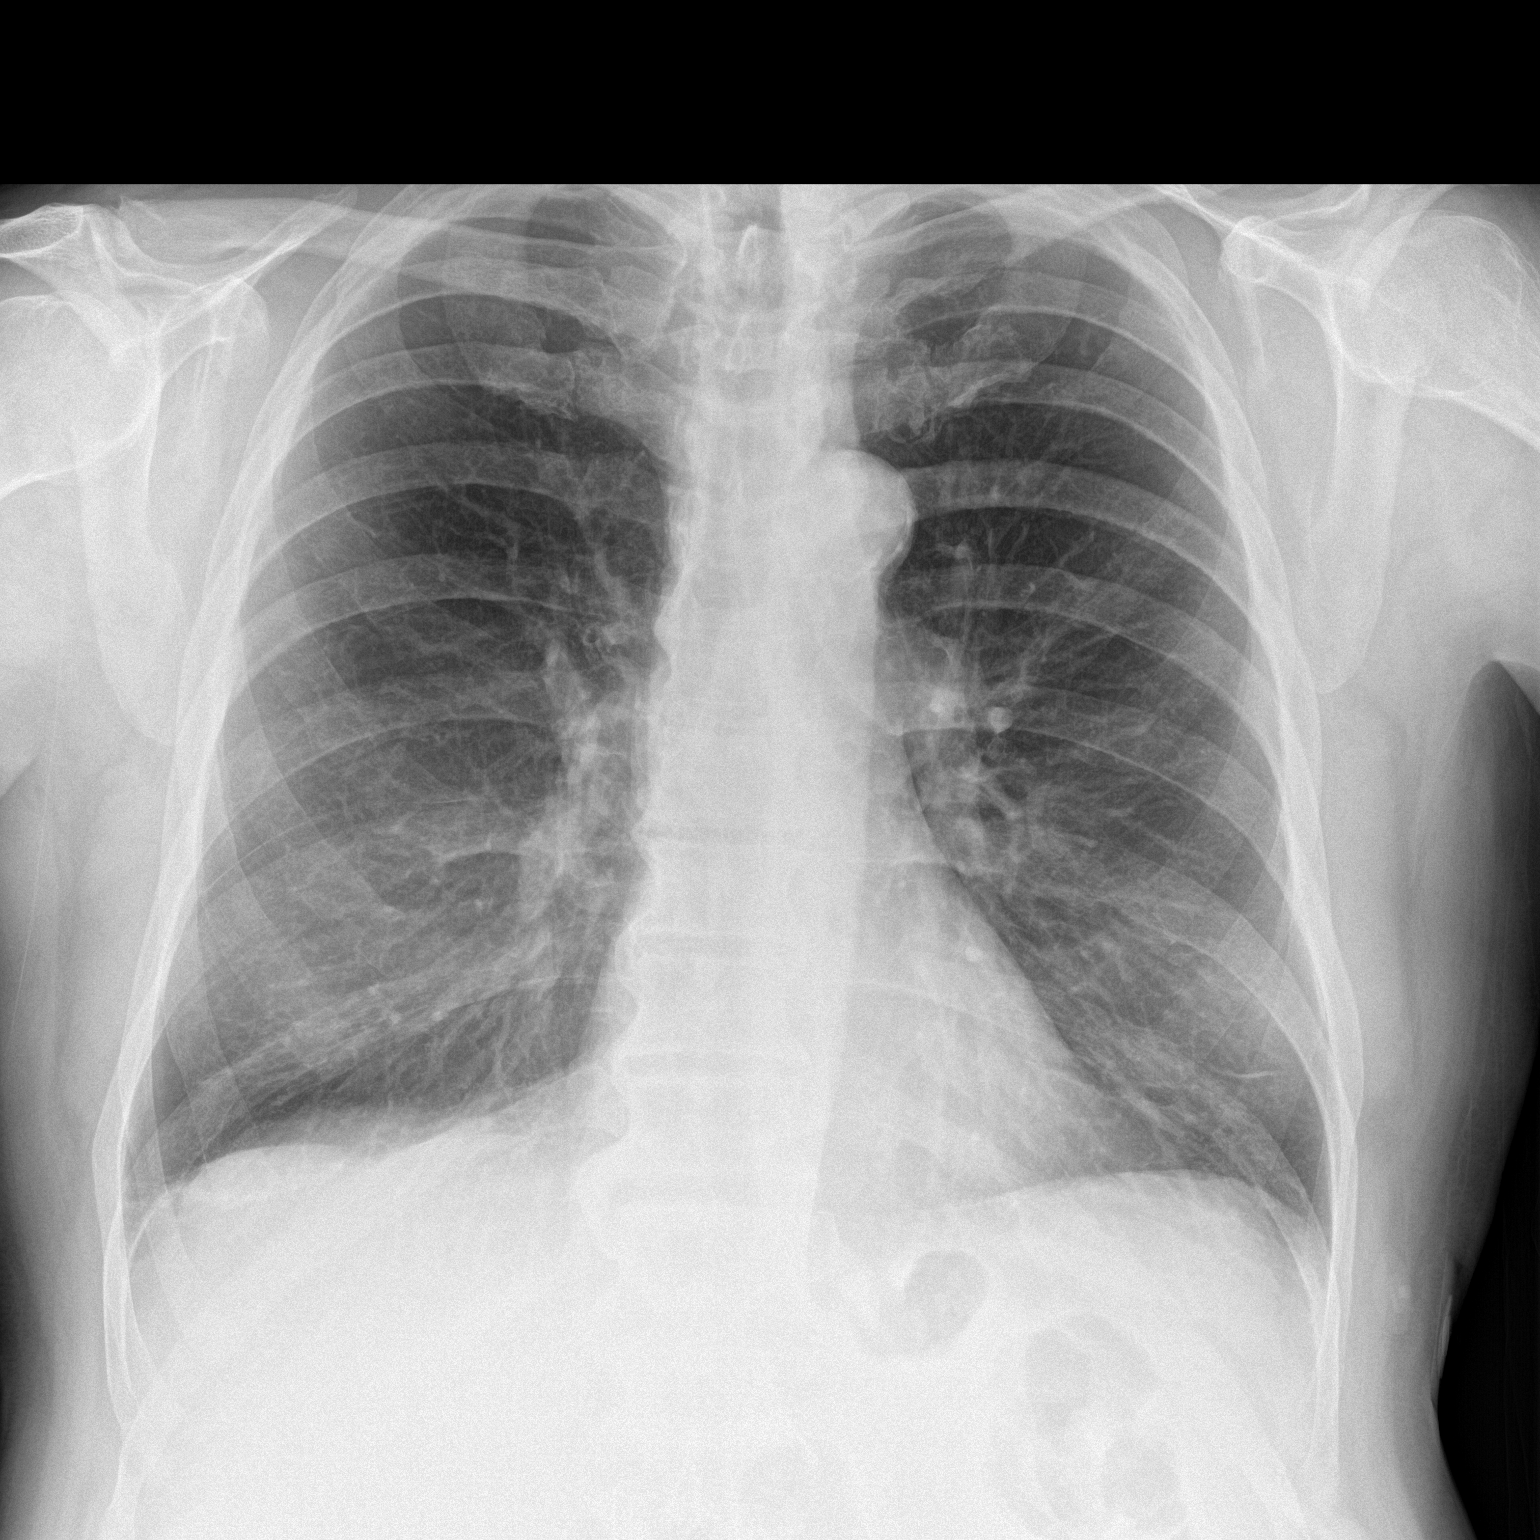

[chest lat]
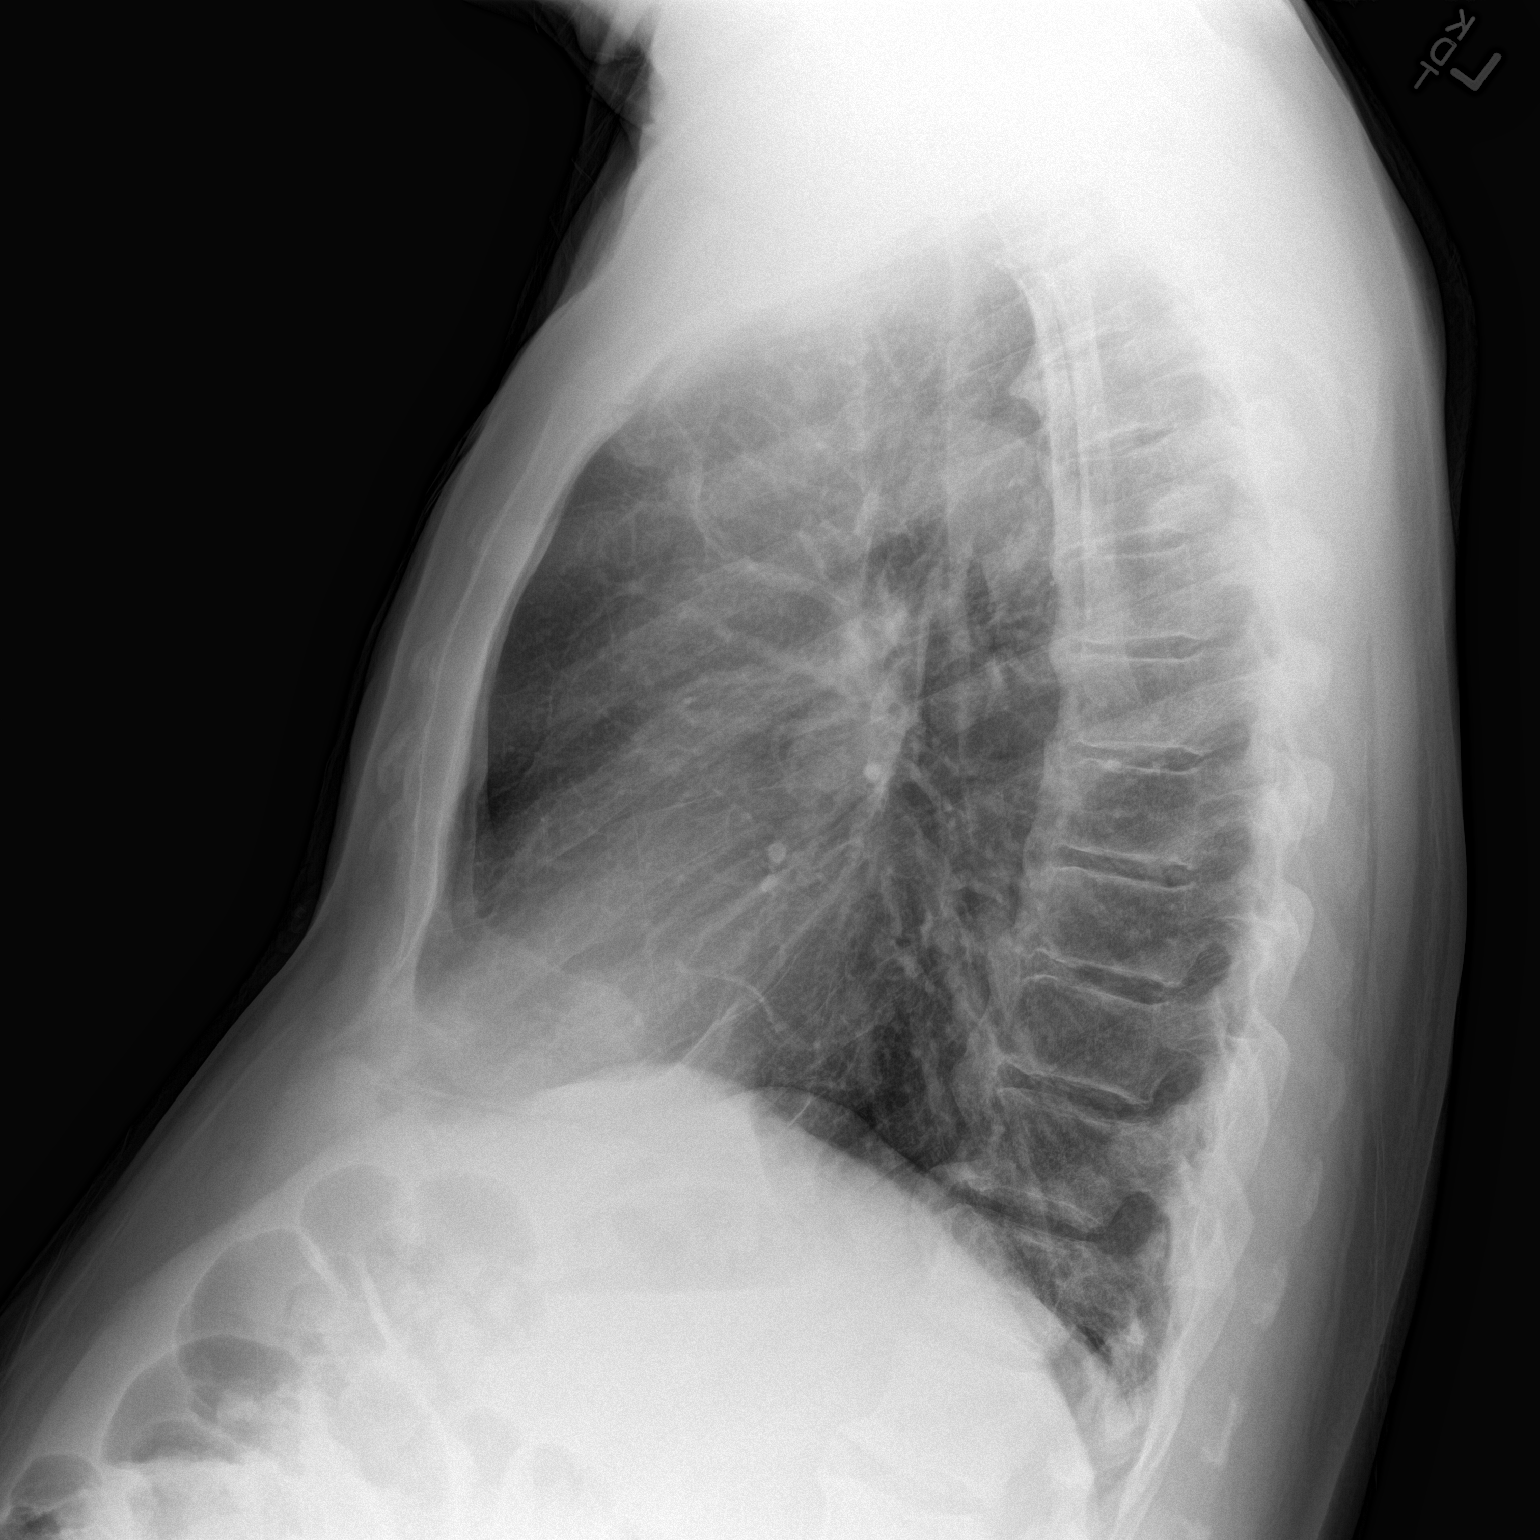

[2 of 2 positions shown; findings below may reference images not displayed]

FINDINGS: Cardiomediastinal silhouette is within normal limits. Thoracic
aortic calcification is noted. The lungs are normally to mildly
hyperinflated. Mild prominence of the interstitial markings is
unchanged. No confluent airspace opacity, pulmonary edema, pleural
effusion, or pneumothorax is identified. No acute osseous
abnormality is seen.
IMPRESSION: No active cardiopulmonary disease.

## 2015-08-30 ENCOUNTER — Encounter: Primary: Family Medicine

## 2015-09-03 ENCOUNTER — Encounter: Attending: Family Medicine | Primary: Family Medicine

## 2015-09-06 ENCOUNTER — Encounter: Attending: Family Medicine | Primary: Family Medicine

## 2015-09-07 ENCOUNTER — Emergency Department: Admit: 2015-09-07 | Payer: PRIVATE HEALTH INSURANCE | Primary: Family Medicine

## 2015-09-07 ENCOUNTER — Ambulatory Visit: Admit: 2015-09-07 | Discharge: 2015-09-07 | Attending: Family Medicine | Primary: Family Medicine

## 2015-09-07 ENCOUNTER — Encounter: Primary: Family Medicine

## 2015-09-07 ENCOUNTER — Inpatient Hospital Stay
Admit: 2015-09-07 | Discharge: 2015-09-07 | Disposition: A | Discharge status: Home / Self Care | Payer: PRIVATE HEALTH INSURANCE | Attending: Emergency Medicine

## 2015-09-07 DIAGNOSIS — R0602 Shortness of breath: Secondary | ICD-10-CM

## 2015-09-07 DIAGNOSIS — I824Z3 Acute embolism and thrombosis of unspecified deep veins of distal lower extremity, bilateral: Secondary | ICD-10-CM

## 2015-09-07 LAB — METABOLIC PANEL, COMPREHENSIVE
A-G Ratio: 1.2 (ref 1.2–3.5)
ALT (SGPT): 104 U/L — ABNORMAL HIGH (ref 12–65)
AST (SGOT): 61 U/L — ABNORMAL HIGH (ref 15–37)
Albumin: 3.9 g/dL (ref 3.2–4.6)
Alk. phosphatase: 97 U/L (ref 50–136)
Anion gap: 9 mmol/L (ref 7–16)
BUN: 12 MG/DL (ref 8–23)
Bilirubin, total: 0.5 MG/DL (ref 0.2–1.1)
CO2: 24 mmol/L (ref 21–32)
Calcium: 8.8 MG/DL (ref 8.3–10.4)
Chloride: 108 mmol/L — ABNORMAL HIGH (ref 98–107)
Creatinine: 1.39 MG/DL (ref 0.8–1.5)
GFR est AA: >60 mL/min/{1.73_m2} (ref >60)
GFR est non-AA: 55 mL/min/{1.73_m2} — ABNORMAL LOW (ref >60)
Globulin: 3.3 g/dL (ref 2.3–3.5)
Glucose: 111 mg/dL — ABNORMAL HIGH (ref 65–100)
Potassium: 3.5 mmol/L (ref 3.5–5.1)
Protein, total: 7.2 g/dL (ref 6.3–8.2)
Sodium: 141 mmol/L (ref 136–145)

## 2015-09-07 LAB — CBC WITH AUTOMATED DIFF
ABS. BASOPHILS: 0 10*3/uL (ref 0.0–0.2)
ABS. EOSINOPHILS: 0.1 10*3/uL (ref 0.0–0.8)
ABS. IMM. GRANS.: 0 10*3/uL (ref 0.0–0.5)
ABS. LYMPHOCYTES: 1.7 10*3/uL (ref 0.5–4.6)
ABS. MONOCYTES: 0.4 10*3/uL (ref 0.1–1.3)
ABS. NEUTROPHILS: 5.1 10*3/uL (ref 1.7–8.2)
BASOPHILS: 0 % (ref 0.0–2.0)
EOSINOPHILS: 2 % (ref 0.5–7.8)
HCT: 41.1 % (ref 41.1–50.3)
HGB: 14 g/dL (ref 13.6–17.2)
IMMATURE GRANULOCYTES: 0 % (ref 0.0–5.0)
LYMPHOCYTES: 24 % (ref 13–44)
MCH: 30.9 PG (ref 26.1–32.9)
MCHC: 34.1 g/dL (ref 31.4–35.0)
MCV: 90.7 FL (ref 79.6–97.8)
MONOCYTES: 5 % (ref 4.0–12.0)
MPV: 9.8 FL — ABNORMAL LOW (ref 10.8–14.1)
NEUTROPHILS: 69 % (ref 43–78)
PLATELET: 288 10*3/uL (ref 150–450)
RBC: 4.53 M/uL (ref 4.23–5.67)
RDW: 13.3 % (ref 11.9–14.6)
WBC: 7.3 10*3/uL (ref 4.3–11.1)

## 2015-09-07 MED ORDER — IPRATROPIUM-ALBUTEROL 2.5 MG-0.5 MG/3 ML NEB SOLUTION
2.5 mg-0.5 mg/3 ml | RESPIRATORY_TRACT | Status: AC
Start: 2015-09-07 — End: 2015-09-07
  Administered 2015-09-07: 21:00:00 via RESPIRATORY_TRACT

## 2015-09-07 MED ORDER — PREDNISONE 50 MG TAB
50 mg | ORAL | Status: AC
Start: 2015-09-07 — End: 2015-09-07
  Administered 2015-09-07: 21:00:00 via ORAL

## 2015-09-07 MED FILL — IPRATROPIUM-ALBUTEROL 2.5 MG-0.5 MG/3 ML NEB SOLUTION: 2.5 mg-0.5 mg/3 ml | RESPIRATORY_TRACT | Qty: 3

## 2015-09-07 MED FILL — PREDNISONE 10 MG TAB: 10 mg | ORAL | Qty: 1

## 2015-09-07 NOTE — ED Notes (Signed)
Patient requested [facility] be called for transport.  [employer] was called and representative states patient not eligible for transport.  Patient informed.

## 2015-09-07 NOTE — ED Notes (Signed)
Patient to radiology.

## 2015-09-07 NOTE — Progress Notes (Signed)
Gave information for [employer] , [employer] and local shelters with nurse to give to patient.   Bus vouchers left with nurse to give to patient.

## 2015-09-07 NOTE — Progress Notes (Signed)
[facility]  Darlys Gales, M.D.  Family Medicine  [address]  [place], [place] [address]  Office : [phone]  Fax : [phone]      Chief Complaint   Patient presents with   ??? Leg Swelling   ??? Other     Social issues       Patrick Paul was dropped off the clinic today by [name] with 2 bags continue in close in order belongings.  A lot has happened since his last visit.  He is no longer staying with his sister and apparently was staying with the [facility] for period of time.  He states that he has no place to go today.  His overall mental status has changed and he is not coherent in his thoughts and speech.  Also seems to be very impatient and on the verge of exhibiting anger when redirection attempted.  Very repetitive in his discussion on point of view.  Very difficult to figure out how compliant he is with his medication and all the things going on in recent months.  He gave me the phone number to speak with the director of a facility who did not have much to say about his present condition other than he is no longer the facility.  He was concerned about some swelling in his lower limbs.  Also complains of some shortness of breath.  No fever or chills.  I spent a while trying to convince patient that he would be better evaluated in the emergency room in view of his multiple medical problems including his shortness of breath.  He got angry however and stated he was not going back to any emergency room.  Overall I felt that the patient was in danger to both himself and his environment if his physical and mental conditions are not treated in an emergent way.  Police department contacted and deputies arrived at the office.  They managed to convince patient to agree to go to the emergency room in a voluntary way and I agreed with that.  Will need to follow-up on the many issues leading to this unfortunate situation after his emergency room visit.  His vital signs were stable in the office setting.   Spent over 1 hour coordinating all of the above  Darlys Gales MD    Elements of this note have been dictated using speech recognition software.  Although reviewed, errors of speech recognition may have occurred.

## 2015-09-07 NOTE — ED Provider Notes (Addendum)
HPI Comments: 65 y/o m w hsx copd, dvts, bipolar d/o, dm and htn to ed for eval.  Pt rambles in his speech but will focus when pressed.  Admits this is the 7th hospital he has been to in recent months, he is homeless, thinks he has blood clots in his lower legs, has cough and sob, more than usual, as well just doesn't feel good.  Unable to tell me if fever or chills.  Complains with body aches.  Some sputum production but not sure what color.  Complains with dry skin.  Takes his xarelto but thinks he needs to take it twice daily.  Has cut back on his cigarettes.     Patient is a 65 y.o. male presenting with leg pain and mental health disorder. The history is provided by the patient and medical records. No language interpreter was used.   Leg Pain    This is a recurrent problem. Episode onset: unable to state. The problem occurs constantly. The problem has not changed since onset.The pain is present in the left lower leg and right lower leg. The quality of the pain is described as aching. The pain is moderate. Associated symptoms include stiffness. Pertinent negatives include no numbness, full range of motion, no tingling, no itching, no back pain and no neck pain.   Mental Health Problem    Pertinent negatives include no confusion, no weakness, no tingling and no numbness.        Past Medical History:   Diagnosis Date   ??? Allergic rhinitis    ??? Asthma    ??? Cancer (HCC)      skin   ??? COPD      emphysema   ??? Diabetes (HCC)      NIDDM   ??? Diabetes (HCC)    ??? HLD (hyperlipidemia)    ??? Hypertension    ??? Obesity due to excess calories 02/05/2015   ??? Other ill-defined conditions(799.89)      DVT ble/ lue   ??? Psychiatric disorder      bipolar   ??? Seizure (HCC)    ??? Skin cancer      nose   ??? Sleep disorder    ??? 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 Pr cardiac surg procedure unlist       heart cath 2007   ??? Hx other surgical        abdominal obstruction in 2012   ??? Hx cataract removal Left 2012         Family History:   Problem Relation Age of Onset   ??? Heart Disease Mother    ??? Diabetes Mother    ??? Cancer Father    ??? Hypertension Sister    ??? Thyroid Disease Sister    ??? Lung Disease Neg Hx        Social History     Social History   ??? Marital status: DIVORCED     Spouse name: N/A   ??? Number of children: N/A   ??? Years of education: N/A     Occupational History   ??? Not on file.     Social History Main Topics   ??? Smoking status: Current Every Day Smoker     Packs/day: 1.00     Years: 55.00     Types: Cigarettes   ??? Smokeless tobacco:  Never Used      Comment: up to 2 - 3 ppd   ??? Alcohol use 0.0 oz/week     0 Standard drinks or equivalent per week   ??? Drug use: Yes     Special: Marijuana   ??? Sexual activity: Not Currently     Other Topics Concern   ??? Not on file     Social History Narrative         ALLERGIES: Latex, natural rubber; Other food; Grapefruit; and Haldol [haloperidol lactate]    Review of Systems   Constitutional: Negative for chills and fever.   HENT: Negative for facial swelling and mouth sores.    Eyes: Negative for discharge and redness.   Respiratory: Positive for cough and wheezing.    Gastrointestinal: Negative for abdominal pain, diarrhea, nausea and vomiting.   Endocrine: Negative for cold intolerance and heat intolerance.   Genitourinary: Negative for difficulty urinating and dysuria.   Musculoskeletal: Positive for stiffness. Negative for back pain, gait problem and neck pain.   Skin: Positive for rash (dry skin generally). Negative for color change and itching.   Neurological: Negative for dizziness, tingling, weakness and numbness.   Psychiatric/Behavioral: Negative for confusion and decreased concentration.       Vitals:    09/07/15 1319   BP: 172/86   Pulse: 80   Resp: 16   Temp: 96 ??F (35.6 ??C)   SpO2: 96%   Weight: 96.6 kg (213 lb)   Height: 6' (1.829 m)            Physical Exam    Constitutional: He is oriented to person, place, and time. He appears well-developed and well-nourished. No distress.   HENT:   Head: Normocephalic and atraumatic.   Right Ear: External ear normal.   Left Ear: External ear normal.   Nose: Nose normal.   Eyes: Conjunctivae and EOM are normal. Pupils are equal, round, and reactive to light.   Neck: Normal range of motion. Neck supple.   Cardiovascular: Normal rate, regular rhythm and normal heart sounds.    Pulmonary/Chest: Effort normal. No respiratory distress. He has wheezes. He has rales.   Abdominal: Soft. Bowel sounds are normal. He exhibits no distension. There is no tenderness.   Musculoskeletal: Normal range of motion. He exhibits no edema or tenderness.   Chronic changes noted to skin of lower extremities.  1+ edema with pos pulses.   Neurological: He is alert and oriented to person, place, and time. No cranial nerve deficit. Coordination normal.   Skin: Skin is warm and dry. No rash noted.   Psychiatric: Judgment and thought content normal. His speech is not rapid and/or pressured, not delayed, not tangential and not slurred. He is slowed. Cognition and memory are normal. He is communicative.   Pt conversation rambles from topic to topic but he will re focus when directed.  Slow responses, but he is appropriate in behavior with slow odd affect.   Nursing note and vitals reviewed.       MDM  Number of Diagnoses or Management Options  Diagnosis management comments: 65 y/o m w multiple med problems to ed with cough, lower ext edema.  Records indicate BPD, dvt, dm, copd and htn.  Will eval dopplers to rule out acute dvt (he is already on xarelto and taking it as prescribed) as well eval cxr due to cough worse than usual, provide in haled bd and oral steroids,  Re eval shortly  6:06 PM  Feeling  a little better.  US shows new dvt right lower ext.  Admits he has been on xarelto for at least one year, had rsx filled on the  16th of janu (showed me pill bottle).  i discussed with dr thorn and on call hematology dr Welton Flakes - risks of changing him to coumadin worse than keeping him on xarelto - likely non compliant./  i will encourage compliance with meds and have him follow with his family md next week for continued care.       Amount and/or Complexity of Data Reviewed  Clinical lab tests: ordered and reviewed  Tests in the radiology section of CPT??: ordered and reviewed  Discuss the patient with other providers: yes (Thorn, khan)    Risk of Complications, Morbidity, and/or Mortality  Presenting problems: minimal  Diagnostic procedures: minimal  Management options: minimal    Patient Progress  Patient progress: stable    ED Course       Procedures

## 2015-09-07 NOTE — ED Notes (Signed)
I have reviewed discharge instructions with the patient.  The patient verbalized understanding.  Info from social work given.  Patient ambulated to lobby with no acute distress noted.

## 2015-09-07 NOTE — Progress Notes (Signed)
Met with patient briefly / patient states he lives with someone that he pays rent to and believes he is "paid up" until February 1st / patient states hx at [employer] but states "I am not allowed to go back there."  Encouraged patient to reach niece r/t patient states his niece could find out if he's paid up until February 1st as stated.  Patient then states , "I tried but she hangs up on me everytime I call her."

## 2015-09-07 NOTE — ED Triage Notes (Signed)
Pt. States he is here for leg pain, states he did not want to come but was told he had to by police. Pt. With hx of leg problems. Pt. With hx of bipolar. Hard to decipher an actual complaint. Pt. Speaking about a wide variety of medical problems.

## 2015-09-10 NOTE — Progress Notes (Signed)
Social Work [employer] received referral from RN CM after concerns from pt's PCP appt on 1/20.    CM outreached to both pt's sister as well as pt's nurse case manager with his Medicaid/Medicare plan ([employer]) named [name].    Cm spoke at length with both sister and [name].  Pt's sister reports long history of mental health concerns and pt is currently living in a boarding house in [place], [place].  Pt's sister reports pt's niece fills pt's medicines weekly but does not feel pt is compliant.  Pt's sister would like pt to live in a locked facility if possible.  Pt is a client with [facility] although sister does not feel positively about his care received.    Pt's nurse case manager with [employer], [name], is very involved in pt's case and has been working directly with pt and sister for some time.  [name] not sure that pt needs a locked facility and has suggested [facility] in [place] to be an option.  Pt's sister was resistant to this idea when first approached.  [name] reports that pt will often present somewhat stable at appt's and this has inhibited any kind of locked or mandated care thus far. [name] feels if pt was in setting where there was staff to remind to take his medication, versus filling a pill box, this would greatly improve his mental state and could then address other medical concerns as well.    [name] asked CM to call her back towards end of the week to allow time to talk with both pt and family about revisiting [facility] as a living option.  CM provided contact information and encouraged to call sooner if has needs.  [name] mentioned she may request documentation from providers in the future if/when the housing option changes.      CM will alert both RN CM Lenise Herald and PCP, Dr. Fredrik Rigger, of conversation.     This note will not be viewable in MyChart.

## 2015-09-10 NOTE — Progress Notes (Signed)
Referral for social work  from Dr. Fredrik Rigger received via phone. Patrick Paul was seen in PCP office exhibiting odd behavior, was sent to ED.    ED discharged patient post findings of DVT. Patient already Rx Xarelto and claims to be taking.    Call to home number, no answer.  Call to sister, Patrick Paul. Patrick Paul states her brother is living at a boarding home in [place]. Patrick Paul states the boarding home staff is charged with giving him his medicine. However, he is not receiving his medicines as ordered, and is in urgent need of mental health treatment.    Patrick Paul reports being refused by [facility]. She feels he needs IP treatment.     Referral sent to CM SW, Patrick Paul, for assistance.

## 2015-09-10 NOTE — Telephone Encounter (Signed)
Traylon's sister Darden Palmer J:[phone] called she would like to speak with you in regards to her brother Tauno, she would like to discuss what happened in office on Friday and his status now.    Carney Bern will be in and out all day said that she will be home this afternoon or tomorrow am.

## 2015-09-11 NOTE — Progress Notes (Signed)
Follow-up call to sister, Darden Palmer, no answer. VM left with request for call back.

## 2015-09-12 NOTE — Progress Notes (Signed)
Social Work CM has ongoing contact between [employer] CM [employer], who is in touch with pt's sister often, and [employer] RN Case manager Theodis Sato, who is patients Equities trader.    [name] was aware of IP admission and has left messages with [facility] d/c planner to inquire about admission for pt.  CM was provided contact at [facility] from RN [facility] and shared this with [name] (d/c planner at [facility] is: Herbert Moors at [phone]).    [name] will continue to outreach to [facility] today and share the d/c planner with each facility in hopes pt can transfer there once stabilized at [facility].    Cm will continue to be available to [name] and Rn CM to consult or provide information/assistance as needed. [name]'s contact number is: [phone].

## 2015-09-12 NOTE — Progress Notes (Signed)
Social Work CM has been in contact with pt's nurse case Production designer, theatre/television/film, [name], with [employer] plan, [employer].    [name] is checking on a possibility for alternative housing for pt.  At first [facility] but now learned of another option called [facility] which is in [place], [place].  [name] was visiting another patient of hers at this facility and felt it may be a good fit.  It is not locked but has staff to administer medication and some ADL's as needed.    [name] informed CM yesterday she would contact facility to learn more about availability and admission criteria (which most likely requires pt to have an IP stay and then transfer in).  [name] may need documentation at some point to support an IP stay if pt does not end up at ED/IP beforehand.    CM discussed case with director Teresa Pelton briefly this morning.  May check with [facility] to see about self-admit options and bed availability depending on what is learned from [place].    If CM has not heard from [name] by Friday of this week, will attempt for an update.

## 2015-09-12 NOTE — Progress Notes (Signed)
Call received from Mr. Rosevear' sister, Darden Palmer. Carney Bern reports Mr. Hernan has been taken to [facility] and admitted to their IP psychiatric unit.    Carney Bern is requesting a transfer at discharge to the facility discussed in CM SW [facility] note of 1/25 [facility] in [place], [place]).    CM will notify Jerrye Beavers.

## 2015-09-14 NOTE — Progress Notes (Signed)
Social Work CM spoke with pt's RN Sports coach with his insurance plan, Pharmacist, community.    [name] advised she has spoken with both [facility] and also d/c planner at [facility], Burnett Harry.  Burnett Harry has worked with the [employer] in the past and shared she will start an application for pt to be considered.  Pt is still at [facility] and reportedly doing well.    [name] will continue to update CM as this develops.

## 2015-09-21 NOTE — Progress Notes (Signed)
Social Work [employer] followed up with pt's RN Sports coach with his insurance, Pharmacist, community.    Cm inquired about status update re: d/c from [facility] and placement.    [name] shared the d/c planner at [facility] has attempted multiple times to connect with [facility] but has not had returned calls.  Pt is still IP at [facility], most likely awaiting placement per [name].  [name] advised will contact CM once pt is d/c and where d/c too--most likely not returning to same group home came from.      CM notified RN CM of this as well.

## 2015-09-27 NOTE — Progress Notes (Signed)
Call placed to Darden Palmer who has not heard from [facility]. She is unaware of the discharge plan for her brother.

## 2015-10-17 NOTE — Progress Notes (Signed)
Social Work [employer] contacted [employer] insurance case Production designer, theatre/television/film, Pharmacist, community, for update on pt status as neither RN CM or SW CM has heard anything in past 2-3 weeks.    [name] advised pt is still in [facility] behavioral health unit.  [name] has left several messages with d/c planner but no returned calls recently.  Pt cannot return to his boarding house he was in and can't move in with sister so placement is a concern and [name] feels this is why pt may still be IP.  [name] has placed a call to a potential placement in [place] but no feedback as of yet.  [name] would also like [facility] d/c planner to consider [facility] in [place] but pt would have ot participate in their day treatment program and is unsure if pt would be willing.    [name] advised will update SW CM once pt is d/c.    This note will not be viewable in MyChart.

## 2015-11-12 NOTE — Progress Notes (Signed)
Outreach call placed to Patrick PalmerJean Paul, sister of [name]. [name] states [name] [name] a group home in [name] [name], she is unsure of the name. She reports he is given his medication at this home,  and taken to [name] for regular appointments.    [name], from his insurance is still working on getting him into [facility] which is the sister's preference.    No further needs at this time. CM closed with no further outreach planned.

## 2015-11-13 NOTE — Progress Notes (Signed)
Social Work [employer] reviewed record after RN CM advised of pt's placement.    No further planned outreaches to pt or RN CM with insurance company at this time.

## 2016-01-09 ENCOUNTER — Ambulatory Visit
Admit: 2016-01-09 | Discharge: 2016-01-09 | Payer: PRIVATE HEALTH INSURANCE | Attending: Family Medicine | Primary: Family Medicine

## 2016-01-09 DIAGNOSIS — E119 Type 2 diabetes mellitus without complications: Secondary | ICD-10-CM

## 2016-01-09 MED ORDER — LANCETS
11 refills | Status: DC
Start: 2016-01-09 — End: 2016-09-02

## 2016-01-09 MED ORDER — TIOTROPIUM BROMIDE 18 MCG CAPS WITH INHALATION DEVICE
18 mcg | ORAL_CAPSULE | Freq: Every day | RESPIRATORY_TRACT | 11 refills | Status: DC
Start: 2016-01-09 — End: 2016-08-26

## 2016-01-09 MED ORDER — METFORMIN 500 MG TAB
500 mg | ORAL_TABLET | Freq: Two times a day (BID) | ORAL | 3 refills | Status: DC
Start: 2016-01-09 — End: 2016-09-02

## 2016-01-09 MED ORDER — BLOOD GLUCOSE METER KIT
PACK | 0 refills | Status: DC
Start: 2016-01-09 — End: 2016-08-26

## 2016-01-09 MED ORDER — LISINOPRIL 20 MG TAB
20 mg | ORAL_TABLET | Freq: Every day | ORAL | 3 refills | Status: DC
Start: 2016-01-09 — End: 2016-09-02

## 2016-01-09 MED ORDER — FENOFIBRATE NANOCRYSTALLIZED 145 MG TAB
145 mg | ORAL_TABLET | Freq: Every day | ORAL | 3 refills | Status: DC
Start: 2016-01-09 — End: 2016-08-26

## 2016-01-09 MED ORDER — SITAGLIPTIN 100 MG TAB
100 mg | ORAL_TABLET | Freq: Every day | ORAL | 3 refills | Status: DC
Start: 2016-01-09 — End: 2016-09-02

## 2016-01-09 MED ORDER — BLOOD SUGAR DIAGNOSTIC TEST STRIPS
ORAL_STRIP | 11 refills | Status: DC
Start: 2016-01-09 — End: 2016-09-02

## 2016-01-09 NOTE — Progress Notes (Signed)
[facility]  Fabio Asa, M.D.  Family Medicine  [address] [place] [address]  Office : [phone]  Fax : [phone]        Chief Complaint   Patient presents with   ??? Diabetes   ??? Hypertension   ??? Cholesterol Problem   ??? Wax in Ear         HISTORY OF PRESENT ILLNESS:     Patrick Paul presents for evaluation of chronic problems.  He has a new concern regarding difficulty hearing.  Has had problems with wax before.  No ear pain.  Currently living in a group home in [place].  Attends [name]      Diabetes Mellitus  Patient presents  for follow up on diabetes.  Onset of symptoms was several years ago. Patient describes symptoms as none. Course to date has been well controlled.Diet and Lifestyle: follows a diabetic diet regularly, exercises sporadically, nonsmoker  Home glucose monitoring:  Results of minimum at 90, maximum at 170, and most at 120. Patient denies polyuria and polydipsia. No wounds in lower limbs.  Most recent HbA1c was:   Lab Results   Component Value Date/Time    Hemoglobin A1c 7.4 04/25/2015 09:43 AM       Hypertension  Patient is in for Hypertension which is stable.    Diet and Lifestyle: generally follows a low sodium diet  Home BP Monitoring: is well controlled at home, ranging 130's/70's.  Patient's last three blood pressure readings were:   BP Readings from Last 3 Encounters:   01/09/16 150/79   09/07/15 (!) 121/96   09/07/15 122/77      taking medications as instructed, no medication side effects noted, no TIA's, no chest pain on exertion, no dyspnea on exertion, no swelling of ankles. Cardiac risk factors consist of dyslipidemia, male gender, hypertension.    Hyperlipidemia  Patient is in for follow-up for hyperlipidemia.  Diet and Lifestyle: generally follows a low fat low cholesterol diet, nonsmoker. Risk factors for vascular disease consist of hyperlipidemia and hypertension.  Recent lipid profile was:   Lab Results   Component Value Date/Time     Cholesterol, total 322 04/25/2015 09:43 AM    HDL Cholesterol 45 04/25/2015 09:43 AM    LDL, calculated 62 04/25/2015 09:43 AM    VLDL, calculated 215 04/25/2015 09:43 AM    Triglyceride 1076 04/25/2015 09:43 AM    CHOL/HDL Ratio 7.2 04/25/2015 09:43 AM   . LFTs normal.  No muscle aches.         ROS:     Respiratory ROS: no cough, shortness of breath, or wheezing  Cardiovascular ROS: no chest pain or dyspnea on exertion      Past Medical History:   Diagnosis Date   ??? Allergic rhinitis    ??? Asthma    ??? Cancer ([place])     skin   ??? COPD     emphysema   ??? Diabetes ([name])     NIDDM   ??? Diabetes ([name])    ??? HLD (hyperlipidemia)    ??? Hypertension    ??? Obesity due to excess calories 02/05/2015   ??? Other ill-defined conditions(799.89)     DVT ble/ lue   ??? Psychiatric disorder     bipolar   ??? Seizure ([place])    ??? Skin cancer     nose   ??? Sleep disorder    ??? Thromboembolus ([place])     subclavian vein in 2007 +  lower limb     Past Surgical History:   Procedure Laterality Date   ??? CARDIAC SURG PROCEDURE UNLIST      heart cath 2007   ??? HX CATARACT REMOVAL Left 2012   ??? HX OTHER SURGICAL      abdominal obstruction in 2012     Social History     Social History   ??? Marital status: DIVORCED     Spouse name: N/A   ??? Number of children: N/A   ??? Years of education: N/A     Social History Main Topics   ??? Smoking status: Current Every Day Smoker     Packs/day: 1.00     Years: 55.00     Types: Cigarettes   ??? Smokeless tobacco: Never Used      Comment: up to 2 - 3 ppd   ??? Alcohol use 0.0 oz/week     0 Standard drinks or equivalent per week   ??? Drug use: Yes     Special: Marijuana   ??? Sexual activity: Not Currently     Other Topics Concern   ??? Not on file     Social History Narrative     Current Outpatient Prescriptions on File Prior to Visit   Medication Sig Dispense Refill   ??? gabapentin (NEURONTIN) 600 mg tablet Take  by mouth three (3) times daily.     ??? albuterol (PROVENTIL HFA, VENTOLIN HFA, PROAIR HFA) 90 mcg/actuation  inhaler Take 2 Puffs by inhalation every six (6) hours as needed for Wheezing or Shortness of Breath. 1 Inhaler 11   ??? rivaroxaban (XARELTO) 20 mg tab tablet Take 1 Tab by mouth daily (with breakfast). 30 Tab 11     No current facility-administered medications on file prior to visit.        PHYSICAL EXAM  Visit Vitals   ??? BP 150/79 (BP 1 Location: Left arm, BP Patient Position: Sitting)   ??? Pulse 74   ??? Ht 6' (1.829 m)   ??? Wt 212 lb 9.6 oz (96.4 kg)   ??? BMI 28.83 kg/m2       Chest - clear to auscultation, no wheezes, rales or rhonchi, symmetric air entry  Heart - normal rate, regular rhythm, normal S1, S2, no murmurs, rubs, clicks or gallops  Ears--wax present in both ears  ASSESSMENT and PLAN    Orders Placed This Encounter   ??? REMOVE IMPACTED EAR WAX   ??? METABOLIC PANEL, BASIC   ??? LIPID PANEL   ??? HEPATIC FUNCTION PANEL   ??? HEMOGLOBIN A1C W/O EAG   ??? MICROALBUMIN, UR, RAND W/ MICROALBUMIN/CREA RATIO   ??? fluticasone (FLOVENT DISKUS) 50 mcg/actuation inhaler   ??? benztropine (COGENTIN) 1 mg tablet   ??? OLANZapine (ZYPREXA) 15 mg tablet   ??? docusate sodium (DOK) 100 mg capsule   ??? linaclotide (LINZESS) 290 mcg cap capsule   ??? risperiDONE (RISPERDAL) 1 mg tablet   ??? traMADol (ULTRAM) 50 mg tablet   ??? Blood-Glucose Meter monitoring kit   ??? glucose blood VI test strips (ASCENSIA AUTODISC VI, ONE TOUCH ULTRA TEST VI) strip   ??? Lancets misc   ??? SITagliptin (JANUVIA) 100 mg tablet   ??? tiotropium (SPIRIVA WITH HANDIHALER) 18 mcg inhalation capsule   ??? lisinopril (PRINIVIL, ZESTRIL) 20 mg tablet   ??? metFORMIN (GLUCOPHAGE) 500 mg tablet   ??? fenofibrate nanocrystallized (TRICOR) 145 mg tablet     lab results and schedule of future lab studies reviewed with patient  reviewed diet, exercise and weight control  cardiovascular risk and specific lipid/LDL goals reviewed  reviewed medications and side effects in detail  ?? DM 2--continue low-calorie diet and current goals  ?? Hypertension--continue low-salt diet and current goals   ?? Hyperlipidemia--continue low cholesterol diet, statin and current goals  ?? Procedure note: After verbal consent obtained both ears lavaged with water.  Tolerated procedure well.  Continue using wax softener.  Tympanic membranes intact.  ?? Bipolar disorder--follow-up [employer]  .  Follow up 4 weeks with labs.    Fabio Asa MD    Elements of this note have been dictated using speech recognition software. Although reviewed, errors of speech recognition may have occurred.

## 2016-01-09 NOTE — Progress Notes (Signed)
Diabetic foot exam:     Left:     Filament test normal sensation with micro filament   Pulse DP: 2+ (normal)   Pulse PT: 2+ (normal)   Deformities: Mild - thick toe nails     Right: Filament test normal sensation with micro filament   Pulse DP: 2+ (normal)   Pulse PT: 2+ (normal)   Deformities: Mild - thick toe nails

## 2016-02-14 ENCOUNTER — Encounter: Primary: Family Medicine

## 2016-02-26 ENCOUNTER — Encounter: Attending: Family Medicine | Primary: Family Medicine

## 2016-04-09 ENCOUNTER — Other Ambulatory Visit: Admit: 2016-04-09 | Discharge: 2016-04-09 | Payer: PRIVATE HEALTH INSURANCE | Primary: Family Medicine

## 2016-04-09 DIAGNOSIS — I10 Essential (primary) hypertension: Secondary | ICD-10-CM

## 2016-04-09 LAB — MICROALBUMIN, UR, RAND W/ MICROALB/CREAT RATIO
Albumin:Creatinine ratio: ABNORMAL mg/g — AB (ref <30)
Creatinine, urine random: 50 mg/dL (ref <300)
Microalbumin,urine random: 10 mg/L (ref <19)

## 2016-04-10 LAB — HEPATIC FUNCTION PANEL
ALT (SGPT): 11 IU/L (ref 0–44)
AST (SGOT): 9 IU/L (ref 0–40)
Albumin: 4.2 g/dL (ref 3.6–4.8)
Alk. phosphatase: 75 IU/L (ref 39–117)
Bilirubin, direct: 0.05 mg/dL (ref 0.00–0.40)
Bilirubin, total: <0.2 mg/dL (ref 0.0–1.2)
Protein, total: 6.4 g/dL (ref 6.0–8.5)

## 2016-04-10 LAB — LIPID PANEL
Cholesterol, total: 199 mg/dL (ref 100–199)
HDL Cholesterol: 55 mg/dL (ref >39)
LDL, calculated: 72 mg/dL (ref 0–99)
Triglyceride: 362 mg/dL — ABNORMAL HIGH (ref 0–149)
VLDL, calculated: 72 mg/dL — ABNORMAL HIGH (ref 5–40)

## 2016-04-10 LAB — HEMOGLOBIN A1C W/O EAG: Hemoglobin A1c: 5.6 % (ref 4.8–5.6)

## 2016-04-10 LAB — METABOLIC PANEL, BASIC
BUN/Creatinine ratio: 18 (ref 10–24)
BUN: 24 mg/dL (ref 8–27)
CO2: 23 mmol/L (ref 18–29)
Calcium: 10 mg/dL (ref 8.6–10.2)
Chloride: 100 mmol/L (ref 96–106)
Creatinine: 1.32 mg/dL — ABNORMAL HIGH (ref 0.76–1.27)
GFR est AA: 65 mL/min/{1.73_m2} (ref >59)
GFR est non-AA: 56 mL/min/{1.73_m2} — ABNORMAL LOW (ref >59)
Glucose: 118 mg/dL — ABNORMAL HIGH (ref 65–99)
Potassium: 4.5 mmol/L (ref 3.5–5.2)
Sodium: 139 mmol/L (ref 134–144)

## 2016-04-16 ENCOUNTER — Encounter: Primary: Family Medicine

## 2016-04-16 ENCOUNTER — Ambulatory Visit
Admit: 2016-04-16 | Discharge: 2016-04-16 | Payer: PRIVATE HEALTH INSURANCE | Attending: Family Medicine | Primary: Family Medicine

## 2016-04-16 DIAGNOSIS — E119 Type 2 diabetes mellitus without complications: Secondary | ICD-10-CM

## 2016-04-16 MED ORDER — ZOSTER VACCINE LIVE (PF) 19,400 UNIT SUB-Q SOLN
19400 unit/0.65 mL | Freq: Once | SUBCUTANEOUS | 0 refills | Status: AC
Start: 2016-04-16 — End: 2016-04-16

## 2016-04-16 MED ORDER — HYDROCORTISONE 2.5 % TOPICAL CREAM
2.5 % | Freq: Two times a day (BID) | CUTANEOUS | 0 refills | Status: DC
Start: 2016-04-16 — End: 2016-08-26

## 2016-04-16 NOTE — Progress Notes (Signed)
HM reviewed and updated with AWV, with the following items to be addressed.  Patient will like Influenza and Prevnar vaccines  Patient will like a scrip for Zostavax  All others are up to date      This is an Initial Medicare Annual Wellness Exam (AWV) (Performed 12 months after IPPE or effective date of Medicare Part B enrollment, Once in a lifetime)    I have reviewed the patient's medical history in detail and updated the computerized patient record.     History     Past Medical History:   Diagnosis Date   ??? Allergic rhinitis    ??? Asthma    ??? Cancer ([place])     skin   ??? COPD     emphysema   ??? Diabetes ([place])     NIDDM   ??? Diabetes ([place])    ??? HLD (hyperlipidemia)    ??? Hypertension    ??? Obesity due to excess calories 02/05/2015   ??? Other ill-defined conditions     DVT ble/ lue   ??? Psychiatric disorder     bipolar   ??? Seizure ([place])    ??? Skin cancer     nose   ??? Sleep disorder    ??? Thromboembolus ([place])     subclavian vein in 2007 + lower limb      Past Surgical History:   Procedure Laterality Date   ??? CARDIAC SURG PROCEDURE UNLIST      heart cath 2007   ??? HX CATARACT REMOVAL Left 2012   ??? HX OTHER SURGICAL      abdominal obstruction in 2012     Current Outpatient Prescriptions   Medication Sig Dispense Refill   ??? divalproex ER (DEPAKOTE ER) 500 mg ER tablet Take  by mouth.     ??? fluticasone (FLOVENT DISKUS) 50 mcg/actuation inhaler Take  by inhalation.     ??? benztropine (COGENTIN) 1 mg tablet Take  by mouth two (2) times a day.     ??? OLANZapine (ZYPREXA) 15 mg tablet Take 15 mg by mouth nightly.     ??? docusate sodium (DOK) 100 mg capsule Take 100 mg by mouth two (2) times a day.     ??? Blood-Glucose Meter monitoring kit Pt tests once daily dx code E11.9 1 Kit 0   ??? glucose blood VI test strips (ASCENSIA AUTODISC VI, ONE TOUCH ULTRA TEST VI) strip Pt tests once daily dx code E11.9 100 Strip 11   ??? Lancets misc Pt tests once daily dx code E11.9 100 Each 11    ??? SITagliptin (JANUVIA) 100 mg tablet Take 1 Tab by mouth daily. 90 Tab 3   ??? lisinopril (PRINIVIL, ZESTRIL) 20 mg tablet Take 1 Tab by mouth daily. 90 Tab 3   ??? metFORMIN (GLUCOPHAGE) 500 mg tablet Take 1 Tab by mouth two (2) times daily (with meals). 180 Tab 3   ??? fenofibrate nanocrystallized (TRICOR) 145 mg tablet Take 1 Tab by mouth daily. 90 Tab 3   ??? gabapentin (NEURONTIN) 600 mg tablet Take  by mouth three (3) times daily.     ??? linaclotide (LINZESS) 290 mcg cap capsule Take  by mouth Daily (before breakfast).     ??? risperiDONE (RISPERDAL) 1 mg tablet Take  by mouth daily.     ??? traMADol (ULTRAM) 50 mg tablet Take 50 mg by mouth every six (6) hours as needed for Pain.     ??? tiotropium (SPIRIVA WITH HANDIHALER) 18 mcg inhalation capsule  Take 1 Cap by inhalation daily. 30 Cap 11   ??? albuterol (PROVENTIL HFA, VENTOLIN HFA, PROAIR HFA) 90 mcg/actuation inhaler Take 2 Puffs by inhalation every six (6) hours as needed for Wheezing or Shortness of Breath. 1 Inhaler 11   ??? rivaroxaban (XARELTO) 20 mg tab tablet Take 1 Tab by mouth daily (with breakfast). 30 Tab 11     Allergies   Allergen Reactions   ??? Latex, Natural Rubber Unknown (comments)   ??? Other Food Other (comments)     Beets cause nausea vomiting and myalgias   ??? Grapefruit Rash   ??? Haldol [Haloperidol Lactate] Other (comments)     "maks me feel woozy, shuffle my feet in place"     Family History   Problem Relation Age of Onset   ??? Heart Disease Mother    ??? Diabetes Mother    ??? Cancer Father    ??? Hypertension Sister    ??? Thyroid Disease Sister    ??? Lung Disease Neg Hx      Social History   Substance Use Topics   ??? Smoking status: Current Every Day Smoker     Packs/day: 1.00     Years: 55.00     Types: Cigarettes   ??? Smokeless tobacco: Never Used      Comment: up to 2 - 3 ppd   ??? Alcohol use 0.0 oz/week     0 Standard drinks or equivalent per week     Patient Active Problem List   Diagnosis Code    ??? Deep vein thrombosis (DVT) of right lower extremity (HCC) I82.401   ??? Acute pulmonary embolism (HCC) I26.99   ??? Tobacco abuse Z72.0   ??? Obesity due to excess calories E66.09   ??? COPD exacerbation (HCC) J44.1   ??? Bipolar 1 disorder (HCC) F31.9   ??? Chronic obstructive pulmonary disease (HCC) J44.9   ??? Mixed hyperlipidemia E78.2   ??? Seizure (HCC) R56.9   ??? Allergic rhinitis J30.9   ??? 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 Diabetes (HCC) E11.9   ??? Essential hypertension I10       Depression Risk Factor Screening:     PHQ over the last two weeks 04/16/2016   Little interest or pleasure in doing things Not at all   Feeling down, depressed or hopeless Not at all   Total Score PHQ 2 0     Alcohol Risk Factor Screening:   You do not drink alcohol or very rarely.      Functional Ability and Level of Safety:     Hearing Loss  Hearing Screening Comments: Patient wear bilateral hearing aids    Vision Screening Comments: Patient is followed by doctor in [place] 2016  Patient wear reading glasses  Patient has history of bilateral cataract removal      Activities of Daily Living  The home contains: no safety equipment  Patient does total self care    Fall RiskFall Risk Assessment, last 12 mths 04/16/2016   Able to walk? Yes   Fall in past 12 months? No       Abuse Screen  Patient is not abused    Cognitive Screening   Evaluation of Cognitive Function:  Has your family/caregiver stated any concerns about your memory: no    MINI COG...patient able to recall 1 of 3 words after 5 minutes.    CDT... patient able to draw circle, place numbers correctly and set hands to requested time without difficulty.  Patient Care Team   Patient Care Team:  Rivka Barbara, MD as PCP - General (Family Practice)  Erick Blinks, NP as Nurse Practitioner (Pulmonary Disease)  Carolin Coy, MD (Pulmonary Disease)    Assessment/Plan   Education and counseling provided:  Are appropriate based on today's review and evaluation     Health Maintenance Due   Topic Date Due   ??? EYE EXAM RETINAL OR DILATED Q1  05/07/1960   ??? DTaP/Tdap/Td series (1 - Tdap) 05/08/1971   ??? FOBT Q 1 YEAR AGE 73-75  05/07/2000   ??? ZOSTER VACCINE AGE 70>  03/06/2010   ??? GLAUCOMA SCREENING Q2Y  05/08/2015   ??? Pneumococcal 65+ Low/Medium Risk (1 of 2 - PCV13) 05/08/2015   ??? MEDICARE YEARLY EXAM  05/08/2015   ??? INFLUENZA AGE 24 TO ADULT  03/18/2016         [facility]  Fabio Asa, M.D.  Family Medicine  [address] [place] [address]  Office : [phone]  Fax : [phone]        Chief Complaint   Patient presents with   ??? Hypertension   ??? Cholesterol Problem   ??? Diabetes         HISTORY OF PRESENT ILLNESS:     Patrick Paul presents for evaluation of chronic problems.      Diabetes Mellitus  Patient presents  for follow up on diabetes.  Onset of symptoms was several years ago. Patient describes symptoms as none. Course to date has been well controlled.Diet and Lifestyle: follows a diabetic diet regularly, exercises sporadically, nonsmoker  Home glucose monitoring:  Results of minimum at 90, maximum at 160, and most at 120. Patient denies polyuria and polydipsia. No wounds in lower limbs.  Most recent HbA1c was:   Lab Results   Component Value Date/Time    Hemoglobin A1c 5.6 04/09/2016 09:14 AM       Hypertension  Patient is in for Hypertension which is stable.    Diet and Lifestyle: generally follows a low sodium diet  Home BP Monitoring: is well controlled at home, ranging 130's/70's.  Patient's last three blood pressure readings were:   BP Readings from Last 3 Encounters:   04/16/16 135/79   01/09/16 150/79   09/07/15 (!) 121/96      taking medications as instructed, no medication side effects noted, no TIA's, no chest pain on exertion, no dyspnea on exertion, no swelling of ankles. Cardiac risk factors consist of dyslipidemia, diabetes mellitus, male gender, hypertension.    Hyperlipidemia   Patient is in for follow-up for hyperlipidemia.  Diet and Lifestyle: generally follows a low fat low cholesterol diet, nonsmoker. Risk factors for vascular disease consist of hyperlipidemia, hypertension and diabetes.  Recent lipid profile was:   Lab Results   Component Value Date/Time    Cholesterol, total 199 04/09/2016 09:14 AM    HDL Cholesterol 55 04/09/2016 09:14 AM    LDL, calculated 72 04/09/2016 09:14 AM    VLDL, calculated 72 04/09/2016 09:14 AM    Triglyceride 362 04/09/2016 09:14 AM    CHOL/HDL Ratio 7.2 04/25/2015 09:43 AM   . LFTs normal.  No muscle aches.         ROS:     Respiratory ROS: no cough, shortness of breath, or wheezing  Cardiovascular ROS: no chest pain or dyspnea on exertion      Past Medical History:   Diagnosis Date   ??? Allergic rhinitis    ???  Asthma    ??? Cancer ([place])     skin   ??? COPD     emphysema   ??? Diabetes ([place])     NIDDM   ??? Diabetes ([place])    ??? HLD (hyperlipidemia)    ??? Hypertension    ??? Obesity due to excess calories 02/05/2015   ??? Other ill-defined conditions     DVT ble/ lue   ??? Psychiatric disorder     bipolar   ??? Seizure ([place])    ??? Skin cancer     nose   ??? Sleep disorder    ??? Thromboembolus ([place])     subclavian vein in 2007 + lower limb     Past Surgical History:   Procedure Laterality Date   ??? CARDIAC SURG PROCEDURE UNLIST      heart cath 2007   ??? HX CATARACT REMOVAL Left 2012   ??? HX OTHER SURGICAL      abdominal obstruction in 2012     Social History     Social History   ??? Marital status: DIVORCED     Spouse name: N/A   ??? Number of children: N/A   ??? Years of education: N/A     Social History Main Topics   ??? Smoking status: Current Every Day Smoker     Packs/day: 1.00     Years: 55.00     Types: Cigarettes   ??? Smokeless tobacco: Never Used      Comment: up to 2 - 3 ppd   ??? Alcohol use 0.0 oz/week     0 Standard drinks or equivalent per week   ??? Drug use: Yes     Special: Marijuana   ??? Sexual activity: Not Currently     Other Topics Concern   ??? None      Social History Narrative     Current Outpatient Prescriptions on File Prior to Visit   Medication Sig Dispense Refill   ??? fluticasone (FLOVENT DISKUS) 50 mcg/actuation inhaler Take  by inhalation.     ??? benztropine (COGENTIN) 1 mg tablet Take  by mouth two (2) times a day.     ??? OLANZapine (ZYPREXA) 15 mg tablet Take 15 mg by mouth nightly.     ??? docusate sodium (DOK) 100 mg capsule Take 100 mg by mouth two (2) times a day.     ??? risperiDONE (RISPERDAL) 1 mg tablet Take  by mouth daily.     ??? traMADol (ULTRAM) 50 mg tablet Take 50 mg by mouth every six (6) hours as needed for Pain.     ??? Blood-Glucose Meter monitoring kit Pt tests once daily dx code E11.9 1 Kit 0   ??? glucose blood VI test strips (ASCENSIA AUTODISC VI, ONE TOUCH ULTRA TEST VI) strip Pt tests once daily dx code E11.9 100 Strip 11   ??? Lancets misc Pt tests once daily dx code E11.9 100 Each 11   ??? SITagliptin (JANUVIA) 100 mg tablet Take 1 Tab by mouth daily. 90 Tab 3   ??? tiotropium (SPIRIVA WITH HANDIHALER) 18 mcg inhalation capsule Take 1 Cap by inhalation daily. 30 Cap 11   ??? lisinopril (PRINIVIL, ZESTRIL) 20 mg tablet Take 1 Tab by mouth daily. 90 Tab 3   ??? metFORMIN (GLUCOPHAGE) 500 mg tablet Take 1 Tab by mouth two (2) times daily (with meals). (Patient taking differently: Take 500 mg by mouth daily (with breakfast).) 180 Tab 3   ??? fenofibrate nanocrystallized (TRICOR) 145 mg tablet  Take 1 Tab by mouth daily. 90 Tab 3   ??? gabapentin (NEURONTIN) 600 mg tablet Take  by mouth three (3) times daily.     ??? albuterol (PROVENTIL HFA, VENTOLIN HFA, PROAIR HFA) 90 mcg/actuation inhaler Take 2 Puffs by inhalation every six (6) hours as needed for Wheezing or Shortness of Breath. 1 Inhaler 11   ??? rivaroxaban (XARELTO) 20 mg tab tablet Take 1 Tab by mouth daily (with breakfast). 30 Tab 11     No current facility-administered medications on file prior to visit.        PHYSICAL EXAM  Visit Vitals    ??? BP 135/79 (BP 1 Location: Left arm, BP Patient Position: Sitting)   ??? Pulse 78   ??? Ht 6' (1.829 m)   ??? Wt 225 lb 3.2 oz (102.2 kg)   ??? BMI 30.54 kg/m2       Chest - clear to auscultation, no wheezes, rales or rhonchi, symmetric air entry  Heart - normal rate, regular rhythm, normal S1, S2, no murmurs, rubs, clicks or gallops    ASSESSMENT and PLAN    Orders Placed This Encounter   ??? INFLUENZA VIRUS VACCINE, HIGH DOSE SEASONAL, PRESERVATIVE FREE   ??? PNEUMOCOCCAL CONJ VACCINE 13 VALENT IM   ??? METABOLIC PANEL, BASIC   ??? HEMOGLOBIN A1C W/O EAG   ??? LIPID PANEL   ??? PSA SCREENING (SCREENING)   ??? OCCULT BLOOD, IMMUNOASSAY (FIT)   ??? divalproex ER (DEPAKOTE ER) 500 mg ER tablet   ??? varicella zoster vacine live (ZOSTAVAX) 19,400 unit/0.65 mL susr injection   ??? hydrocortisone (HYTONE) 2.5 % topical cream     lab results and schedule of future lab studies reviewed with patient  reviewed diet, exercise and weight control  cardiovascular risk and specific lipid/LDL goals reviewed  reviewed medications and side effects in detail  ?? DM 2--continue low-calorie diet and current goals  ?? Hypertension--continue low-salt diet and current goals  ?? Hyperlipidemia--continue low cholesterol diet, statin and current goals  ?? Health maintenance--arrange for Hemoccult stool tests.  Unable to do prep for colonoscopy.  ?? Home situation--no living in [place].  His sister lives in [place].  Seems better controlled for his bipolar disorder  ?? Follow up 4 months with labs.    Fabio Asa MD    Elements of this note have been dictated using speech recognition software. Although reviewed, errors of speech recognition may have occurred.

## 2016-04-16 NOTE — Patient Instructions (Signed)
Medicare Wellness Visit, Male    The best way to live healthy is to have a healthy lifestyle by eating a well-balanced diet, exercising regularly, limiting alcohol and stopping smoking.    Regular physical exams and screening tests are another way to keep healthy.   Preventive exams provided by your health care provider can find health problems before they become diseases or illnesses. Preventive services including immunizations, screening tests, monitoring and exams can help you take care of your own health.    All people over age 66 should have a pneumovax  and and a prevnar shot to prevent pneumonia. These are once in a lifetime unless you and your provider decide differently.    All people over 65 should have a yearly flu shot and a tetanus vaccine every 10 years.    Screening for diabetes mellitus with a blood sugar test should be done every year.    Glaucoma is a disease of the eye due to increased ocular pressure that can lead to blindness and it should be done every year by an eye professional.    Cardiovascular screening tests that check for elevated lipids (fatty part of blood) which can lead to heart disease and strokes should be done every 5 years.    Colorectal screening that evaluates for blood or polyps in your colon should be done yearly as a stool test or every five years as a flexible sigmoidoscope or every 10 years as a colonoscopy up to age 66.    Men up to age 51 may need a screening blood test for prostate cancer at certain intervals, depending on their personal and family history. This decision is between the patient and his provider.    If you have been a smoker or had family history of abdominal aortic aneurysms, you and your provider may decide to schedule an ultrasound test of your aorta.    Hepatitis C screening is also recommended for anyone born between [id number] through 1965.    A shingles vaccine is also recommended once in a lifetime after age 66.     Your Medicare Wellness Exam is recommended annually.    Here is a list of your current Health Maintenance items with a due date:  Health Maintenance Due   Topic Date Due   ??? Eye Exam  05/07/1960   ??? DTaP/Tdap/Td  (1 - Tdap) 05/08/1971   ??? Stool testing for trace blood  05/07/2000   ??? Shingles Vaccine  03/06/2010   ??? Glaucoma Screening   05/08/2015   ??? Pneumococcal Vaccine (1 of 2 - PCV13) 05/08/2015   ??? Annual Well Visit  05/08/2015   ??? Flu Vaccine  03/18/2016

## 2016-04-16 NOTE — ACP (Advance Care Planning) (Signed)
Patient does not have Living Will/HCPOA. Information and documents provided to patient with instructions on completing documents. Requested patient bring copy to scan into chart once completed.

## 2016-04-16 NOTE — Progress Notes (Signed)
Diabetic foot exam:     Left:     Filament test normal sensation with micro filament   Pulse DP: 2+ (normal)   Pulse PT: 2+ (normal)   Deformities: Mild - thick toe nails     Right: Filament test normal sensation with micro filament   Pulse DP: 2+ (normal)   Pulse PT: 2+ (normal)   Deformities: Mild - thick toe nails

## 2016-04-29 MED ORDER — RIVAROXABAN 20 MG TAB
20 mg | ORAL_TABLET | Freq: Every day | ORAL | 11 refills | Status: DC
Start: 2016-04-29 — End: 2016-09-02

## 2016-05-13 ENCOUNTER — Encounter: Payer: PRIVATE HEALTH INSURANCE | Attending: Family Medicine | Primary: Family Medicine

## 2016-06-26 ENCOUNTER — Encounter: Payer: PRIVATE HEALTH INSURANCE | Attending: Family Medicine | Primary: Family Medicine

## 2016-08-20 ENCOUNTER — Encounter: Primary: Family Medicine

## 2016-08-21 ENCOUNTER — Other Ambulatory Visit: Admit: 2016-08-21 | Discharge: 2016-08-21 | Payer: PRIVATE HEALTH INSURANCE | Primary: Family Medicine

## 2016-08-21 DIAGNOSIS — I10 Essential (primary) hypertension: Secondary | ICD-10-CM

## 2016-08-22 LAB — METABOLIC PANEL, BASIC
BUN/Creatinine ratio: 11 (ref 10–24)
BUN: 14 mg/dL (ref 8–27)
CO2: 23 mmol/L (ref 18–29)
Calcium: 10.4 mg/dL — ABNORMAL HIGH (ref 8.6–10.2)
Chloride: 93 mmol/L — ABNORMAL LOW (ref 96–106)
Creatinine: 1.25 mg/dL (ref 0.76–1.27)
GFR est AA: 69 mL/min/{1.73_m2} (ref >59)
GFR est non-AA: 60 mL/min/{1.73_m2} (ref >59)
Glucose: 130 mg/dL — ABNORMAL HIGH (ref 65–99)
Potassium: 5.3 mmol/L — ABNORMAL HIGH (ref 3.5–5.2)
Sodium: 135 mmol/L (ref 134–144)

## 2016-08-22 LAB — LIPID PANEL
Cholesterol, total: 177 mg/dL (ref 100–199)
HDL Cholesterol: 52 mg/dL (ref >39)
LDL, calculated: 56 mg/dL (ref 0–99)
Triglyceride: 343 mg/dL — ABNORMAL HIGH (ref 0–149)
VLDL, calculated: 69 mg/dL — ABNORMAL HIGH (ref 5–40)

## 2016-08-22 LAB — PSA SCREENING (SCREENING): Prostate Specific Ag: 0.4 ng/mL (ref 0.0–4.0)

## 2016-08-22 LAB — HEMOGLOBIN A1C W/O EAG: Hemoglobin A1c: 6.3 % — ABNORMAL HIGH (ref 4.8–5.6)

## 2016-08-24 NOTE — Progress Notes (Signed)
Please  let me know if patient does not show up for appointment

## 2016-08-25 NOTE — Progress Notes (Signed)
ok 

## 2016-08-26 ENCOUNTER — Ambulatory Visit
Admit: 2016-08-26 | Discharge: 2016-08-26 | Payer: PRIVATE HEALTH INSURANCE | Attending: Family Medicine | Primary: Family Medicine

## 2016-08-26 DIAGNOSIS — F319 Bipolar disorder, unspecified: Secondary | ICD-10-CM

## 2016-08-26 NOTE — Progress Notes (Signed)
[facility]  Darlys Gales, M.D.  Family Medicine  [address]  [place], [place] [address]  Office : [phone]  Fax : [phone]        Chief Complaint   Patient presents with   ??? Bipolar   ??? Seizure   ??? COPD         HISTORY OF PRESENT ILLNESS:     [place] presents for evaluation of above problems.  In addition he needs to have forms completed for admission to facility recommended by DSS.  Currently staying in [place] on the supervision of BSS.  He has bipolar disorder and is being managed by mental health.  He is on lithium and Risperdal.  No aggressive behavior.  No recent COPD exacerbation.  Uses albuterol inhaler intermittently.  No recent seizure activity.      ROS:     Cardiovascular ROS: no chest pain or dyspnea on exertion  Gastrointestinal ROS: no abdominal pain, change in bowel habits, or black or bloody stools      Past Medical History:   Diagnosis Date   ??? Allergic rhinitis    ??? Asthma    ??? Cancer (HCC)     skin   ??? COPD     emphysema   ??? Diabetes (HCC)     NIDDM   ??? Diabetes (HCC)    ??? HLD (hyperlipidemia)    ??? Hypertension    ??? Obesity due to excess calories 02/05/2015   ??? Other ill-defined conditions(799.89)     DVT ble/ lue   ??? Psychiatric disorder     bipolar   ??? Seizure (HCC)    ??? Skin cancer     nose   ??? Sleep disorder    ??? 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 CARDIAC SURG PROCEDURE UNLIST      heart cath 2007   ??? HX CATARACT REMOVAL Left 2012   ??? HX OTHER SURGICAL      abdominal obstruction in 2012     Social History     Social History   ??? Marital status: DIVORCED     Spouse name: N/A   ??? Number of children: N/A   ??? Years of education: N/A     Social History Main Topics   ??? Smoking status: Current Every Day Smoker     Packs/day: 1.00     Years: 55.00     Types: Cigarettes   ??? Smokeless tobacco: Never Used      Comment: up to 2 - 3 ppd   ??? Alcohol use 0.0 oz/week      0 Standard drinks or equivalent per week   ??? Drug use: Yes     Special: Marijuana   ??? Sexual activity: Not Currently     Other Topics Concern   ??? None     Social History Narrative     Current Outpatient Prescriptions on File Prior to Visit   Medication Sig Dispense Refill   ??? rivaroxaban (XARELTO) 20 mg tab tablet Take 1 Tab by mouth daily (with breakfast). 30 Tab 11   ??? docusate sodium (DOK) 100 mg capsule Take 100 mg by mouth two (2) times a day.     ??? glucose blood VI test strips (ASCENSIA AUTODISC VI, ONE TOUCH ULTRA TEST VI) strip Pt tests once daily dx code E11.9 100 Strip 11   ???  Lancets misc Pt tests once daily dx code E11.9 100 Each 11   ??? SITagliptin (JANUVIA) 100 mg tablet Take 1 Tab by mouth daily. 90 Tab 3   ??? lisinopril (PRINIVIL, ZESTRIL) 20 mg tablet Take 1 Tab by mouth daily. 90 Tab 3   ??? metFORMIN (GLUCOPHAGE) 500 mg tablet Take 1 Tab by mouth two (2) times daily (with meals). (Patient taking differently: Take 500 mg by mouth daily (with breakfast).) 180 Tab 3   ??? gabapentin (NEURONTIN) 600 mg tablet Take  by mouth three (3) times daily.       No current facility-administered medications on file prior to visit.        PHYSICAL EXAM  Visit Vitals   ??? BP 104/66 (BP 1 Location: Left arm, BP Patient Position: Sitting)   ??? Pulse 98   ??? Ht 6' (1.829 m)   ??? Wt 214 lb 12.8 oz (97.4 kg)   ??? BMI 29.13 kg/m2       Chest - clear to auscultation, no wheezes, rales or rhonchi, symmetric air entry  Heart - normal rate, regular rhythm, normal S1, S2, no murmurs, rubs, clicks or gallops    ASSESSMENT and PLAN    Orders Placed This Encounter   ??? AMB POC TUBERCULOSIS, INTRADERMAL (SKIN TEST)   ??? lithium carbonate 300 mg tablet   ??? risperiDONE (RISPERDAL) 3 mg tablet     lab results and schedule of future lab studies reviewed with patient  reviewed medications and side effects in detail  Bipolar disorder--doing well on current medication  ?? COPD--no recent exacerbation  ?? Seizure--no recent seizure activity   ?? Form completed for preadmission to assisted living facility organized by DSS.   follow-up  to have two-step PPD.  Return for review of his other chronic problems and completion of his two-step PPD      Darlys Galesaana Freeda Spivey MD    Elements of this note have been dictated using speech recognition software. Although reviewed,  errors of speech recognition may have occurred.

## 2016-08-27 ENCOUNTER — Encounter: Attending: Family Medicine | Primary: Family Medicine

## 2016-08-28 ENCOUNTER — Encounter: Primary: Family Medicine

## 2016-08-29 ENCOUNTER — Institutional Professional Consult (permissible substitution): Admit: 2016-08-29 | Discharge: 2016-08-29 | Payer: PRIVATE HEALTH INSURANCE | Primary: Family Medicine

## 2016-08-29 DIAGNOSIS — Z111 Encounter for screening for respiratory tuberculosis: Secondary | ICD-10-CM

## 2016-08-29 LAB — AMB POC TUBERCULOSIS, INTRADERMAL (SKIN TEST)
PPD: NEGATIVE Negative
mm Induration: 0 mm

## 2016-09-02 ENCOUNTER — Ambulatory Visit
Admit: 2016-09-02 | Discharge: 2016-09-02 | Payer: PRIVATE HEALTH INSURANCE | Attending: Family Medicine | Primary: Family Medicine

## 2016-09-02 DIAGNOSIS — E119 Type 2 diabetes mellitus without complications: Secondary | ICD-10-CM

## 2016-09-02 MED ORDER — LANCETS
11 refills | Status: AC
Start: 2016-09-02 — End: ?

## 2016-09-02 MED ORDER — LISINOPRIL 20 MG TAB
20 mg | ORAL_TABLET | Freq: Every day | ORAL | 3 refills | Status: AC
Start: 2016-09-02 — End: ?

## 2016-09-02 MED ORDER — BLOOD SUGAR DIAGNOSTIC TEST STRIPS
ORAL_STRIP | 11 refills | Status: AC
Start: 2016-09-02 — End: ?

## 2016-09-02 MED ORDER — SITAGLIPTIN 100 MG TAB
100 mg | ORAL_TABLET | Freq: Every day | ORAL | 3 refills | Status: AC
Start: 2016-09-02 — End: ?

## 2016-09-02 MED ORDER — METFORMIN 500 MG TAB
500 mg | ORAL_TABLET | Freq: Every day | ORAL | 3 refills | Status: AC
Start: 2016-09-02 — End: ?

## 2016-09-02 MED ORDER — RIVAROXABAN 20 MG TAB
20 mg | ORAL_TABLET | Freq: Every day | ORAL | 11 refills | Status: AC
Start: 2016-09-02 — End: ?

## 2016-09-02 NOTE — Progress Notes (Signed)
Marland Kitchen    [facility]  Darlys Gales, M.D.  Family Medicine  [address]  [place], [place] [address]  Office : [phone]  Fax : [phone]        Chief Complaint   Patient presents with   ??? Diabetes   ??? Cholesterol Problem   ??? Hypertension   ??? Other     Leg weakness         HISTORY OF PRESENT ILLNESS:     Patrick Paul presents for evaluation of chronic problems.  Accompanied by his supervisor at [employer].  Here to have a second two-step PPD placed also.  He has a new concern regarding general weakness in his lower limbs.  Finds it difficult to get up from a sitting position and move freely.      Diabetes Mellitus  Patient presents  for follow up on diabetes.  Onset of symptoms was several years ago. Patient describes symptoms as none. Course to date has been well controlled.Diet and Lifestyle: follows a diabetic diet regularly, exercises sporadically, nonsmoker  Home glucose monitoring:  Results of minimum at 100, maximum at 160, and most at 120. Patient denies polyuria and polydipsia. No wounds in lower limbs.  Most recent HbA1c was:   Lab Results   Component Value Date/Time    Hemoglobin A1c 6.3 08/21/2016 09:10 AM       Hypertension  Patient is in for Hypertension which is stable.    Diet and Lifestyle: generally follows a low sodium diet  Home BP Monitoring: is well controlled at home, ranging 120's/70's.  Patient's last three blood pressure readings were:   BP Readings from Last 3 Encounters:   09/02/16 116/66   08/26/16 104/66   04/16/16 135/79      taking medications as instructed, no medication side effects noted, no TIA's, no chest pain on exertion, no dyspnea on exertion, no swelling of ankles. Cardiac risk factors consist of dyslipidemia, diabetes mellitus, male gender, hypertension.    Hyperlipidemia  Patient is in for follow-up for hyperlipidemia.  Diet and Lifestyle: generally follows a low fat low cholesterol diet, nonsmoker. Risk factors  for vascular disease consist of hyperlipidemia, hypertension and diabetes.  Recent lipid profile was:   Lab Results   Component Value Date/Time    Cholesterol, total 177 08/21/2016 09:10 AM    HDL Cholesterol 52 08/21/2016 09:10 AM    LDL, calculated 56 08/21/2016 09:10 AM    VLDL, calculated 69 08/21/2016 09:10 AM    Triglyceride 343 08/21/2016 09:10 AM    CHOL/HDL Ratio 7.2 04/25/2015 09:43 AM   . LFTs normal.  No muscle aches.         ROS:     Respiratory ROS: no cough, shortness of breath, or wheezing  Cardiovascular ROS: no chest pain or dyspnea on exertion      Past Medical History:   Diagnosis Date   ??? Allergic rhinitis    ??? Asthma    ??? Cancer (HCC)     skin   ??? COPD     emphysema   ??? Diabetes (HCC)     NIDDM   ??? Diabetes (HCC)    ??? HLD (hyperlipidemia)    ??? Hypertension    ??? Obesity due to excess calories 02/05/2015   ??? Other ill-defined conditions(799.89)     DVT ble/ lue   ??? Psychiatric disorder     bipolar   ??? Seizure (HCC)    ??? Skin cancer  nose   ??? Sleep disorder    ??? 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 CARDIAC SURG PROCEDURE UNLIST      heart cath 2007   ??? HX CATARACT REMOVAL Left 2012   ??? HX OTHER SURGICAL      abdominal obstruction in 2012     Social History     Social History   ??? Marital status: DIVORCED     Spouse name: N/A   ??? Number of children: N/A   ??? Years of education: N/A     Social History Main Topics   ??? Smoking status: Current Every Day Smoker     Packs/day: 1.00     Years: 55.00     Types: Cigarettes   ??? Smokeless tobacco: Never Used      Comment: up to 2 - 3 ppd   ??? Alcohol use 0.0 oz/week     0 Standard drinks or equivalent per week   ??? Drug use: Yes     Special: Marijuana   ??? Sexual activity: Not Currently     Other Topics Concern   ??? None     Social History Narrative     Current Outpatient Prescriptions on File Prior to Visit   Medication Sig Dispense Refill    ??? lithium carbonate 300 mg tablet Take 300 mg by mouth two (2) times a day.     ??? risperiDONE (RISPERDAL) 3 mg tablet Take  by mouth.     ??? docusate sodium (DOK) 100 mg capsule Take 100 mg by mouth two (2) times a day.     ??? gabapentin (NEURONTIN) 600 mg tablet Take  by mouth three (3) times daily.       No current facility-administered medications on file prior to visit.        PHYSICAL EXAM  Visit Vitals   ??? BP 116/66 (BP 1 Location: Left arm, BP Patient Position: Sitting)   ??? Pulse 89   ??? Ht 6' (1.829 m)   ??? Wt 215 lb 9.6 oz (97.8 kg)   ??? BMI 29.24 kg/m2       Chest - clear to auscultation, no wheezes, rales or rhonchi, symmetric air entry  Heart - normal rate, regular rhythm, normal S1, S2, no murmurs, rubs, clicks or gallops  Lower extremities--good power in both lower limbs.    ASSESSMENT and PLAN    Orders Placed This Encounter   ??? METABOLIC PANEL, BASIC   ??? HEMOGLOBIN A1C W/O EAG   ??? LIPID PANEL   ??? MICROALBUMIN, UR, RAND W/ MICROALBUMIN/CREA RATIO   ??? METABOLIC PANEL, BASIC   ??? LITHIUM   ??? REFERRAL TO PHYSICAL THERAPY   ??? AMB POC TUBERCULOSIS, INTRADERMAL (SKIN TEST)   ??? metFORMIN (GLUCOPHAGE) 500 mg tablet   ??? lisinopril (PRINIVIL, ZESTRIL) 20 mg tablet   ??? SITagliptin (JANUVIA) 100 mg tablet   ??? Lancets misc   ??? glucose blood VI test strips (ASCENSIA AUTODISC VI, ONE TOUCH ULTRA TEST VI) strip   ??? rivaroxaban (XARELTO) 20 mg tab tablet     lab results and schedule of future lab studies reviewed with patient  reviewed diet, exercise and weight control  cardiovascular risk and specific lipid/LDL goals reviewed  reviewed medications and side effects in detail  ?? DM 2--continue low-calorie diet and current goals  ?? Hypertension--continue low-salt diet and current goals  ??  Hyperlipidemia--continue low cholesterol diet, statin and current goals   ?? leg weakness--refer to physical therapy  ?? Bipolar disorder--check lithium level  ?? History of DVT and PE--continue anticoagulation     Follow up 3 months with labs.  Caseworker asked to let us know if he has been admitted in a facility and  would no longer be followed up.  Here    Darlys Galesaana Shylah Dossantos MD    Elements of this note have been dictated using speech recognition software. Although reviewed, errors of speech recognition may have occurred.

## 2016-09-03 LAB — METABOLIC PANEL, BASIC
BUN/Creatinine ratio: 15 (ref 10–24)
BUN: 17 mg/dL (ref 8–27)
CO2: 21 mmol/L (ref 18–29)
Calcium: 10.4 mg/dL — ABNORMAL HIGH (ref 8.6–10.2)
Chloride: 102 mmol/L (ref 96–106)
Creatinine: 1.13 mg/dL (ref 0.76–1.27)
GFR est AA: 78 mL/min/{1.73_m2} (ref >59)
GFR est non-AA: 67 mL/min/{1.73_m2} (ref >59)
Glucose: 110 mg/dL — ABNORMAL HIGH (ref 65–99)
Potassium: 4.3 mmol/L (ref 3.5–5.2)
Sodium: 140 mmol/L (ref 134–144)

## 2016-09-04 ENCOUNTER — Encounter: Primary: Family Medicine

## 2016-09-04 ENCOUNTER — Institutional Professional Consult (permissible substitution): Admit: 2016-09-04 | Discharge: 2016-09-04 | Payer: PRIVATE HEALTH INSURANCE | Primary: Family Medicine

## 2016-09-04 DIAGNOSIS — Z111 Encounter for screening for respiratory tuberculosis: Secondary | ICD-10-CM

## 2016-09-04 LAB — AMB POC TUBERCULOSIS, INTRADERMAL (SKIN TEST): PPD: NEGATIVE Negative

## 2016-09-09 ENCOUNTER — Encounter: Primary: Family Medicine

## 2016-09-12 ENCOUNTER — Other Ambulatory Visit: Admit: 2016-09-12 | Discharge: 2016-09-12 | Payer: PRIVATE HEALTH INSURANCE | Primary: Family Medicine

## 2016-09-12 DIAGNOSIS — F319 Bipolar disorder, unspecified: Secondary | ICD-10-CM

## 2016-09-12 NOTE — Telephone Encounter (Signed)
The [employer] social came in with patient today to for the patient to have lab work done. They are wanting to know if you can refer him to a podiatrist for his feet? Please advise.

## 2016-09-13 LAB — LITHIUM: Lithium level: 0.4 mmol/L — ABNORMAL LOW (ref 0.6–1.2)

## 2016-09-14 ENCOUNTER — Encounter

## 2016-09-14 NOTE — Progress Notes (Signed)
Please inform patient/POA  that lithium result(s)  normal.  And forwarded to mental health

## 2016-09-15 ENCOUNTER — Inpatient Hospital Stay: Payer: PRIVATE HEALTH INSURANCE | Primary: Family Medicine

## 2016-09-15 NOTE — Telephone Encounter (Signed)
Pt's POA stated the pt uses [facility].

## 2016-09-15 NOTE — Progress Notes (Signed)
Patient/POA notified that his lithium result was normal. And results forwarded to mental health.

## 2016-09-22 ENCOUNTER — Inpatient Hospital Stay: Payer: PRIVATE HEALTH INSURANCE | Primary: Family Medicine

## 2016-09-30 ENCOUNTER — Inpatient Hospital Stay: Admit: 2016-09-30 | Payer: PRIVATE HEALTH INSURANCE | Primary: Family Medicine

## 2016-09-30 DIAGNOSIS — R29898 Other symptoms and signs involving the musculoskeletal system: Secondary | ICD-10-CM

## 2016-09-30 NOTE — Progress Notes (Signed)
Ambulatory/Rehab Services H2 Model Falls Risk Assessment    Risk Factor Pts. ??   Confusion/Disorientation/Impulsivity      4 ??   Symptomatic Depression     2 ??   Altered Elimination     1 ??   Dizziness/Vertigo     1 ??   Gender (Male)     1 ??   Any administered antiepileptics (anticonvulsants):     2 ??   Any administered benzodiazepines:     1 ??   Visual Impairment (specify):     1 ??   Portable Oxygen Use     1 ??   Orthostatic ? BP     1 ??   History of Recent Falls (within 3 mos.)     5     Ability to Rise from Chair (choose one) Pts. ??   Ability to rise in a single movement     0 ??   Pushes up, successful in one attempt     1 ??   Multiple attempts, but successful     3 ??   Unable to rise without assistance     4   Total: (5 or greater = High Risk) 4     Falls Prevention Plan:                   Physical Limitations to Exercise (specify):                   Mobility Assistance Device (type):                   Exercise/Equipment Adaptation (specify):    ??2010 [employer] All Rights Reserved. [place] Patent #7,[id number]. Federal Law prohibits the replication, distribution or use without written permission from [employer]
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
## 2016-12-02 ENCOUNTER — Encounter: Primary: Family Medicine

## 2016-12-09 ENCOUNTER — Encounter: Attending: Family Medicine | Primary: Family Medicine

## 2019-08-19 DEATH — deceased
